# Patient Record
Sex: Male | Born: 1960 | Race: White | Hispanic: No | Smoking: Former smoker
Health system: Southern US, Community
[De-identification: ages and names within clinical notes are randomized; demographics above are authoritative.]

## PROBLEM LIST (undated history)

## (undated) DIAGNOSIS — C801 Malignant (primary) neoplasm, unspecified: Secondary | ICD-10-CM

---

## 2020-10-15 ENCOUNTER — Emergency Department (HOSPITAL_COMMUNITY)

## 2020-10-15 ENCOUNTER — Encounter (HOSPITAL_COMMUNITY): Payer: Self-pay | Admitting: Emergency Medicine

## 2020-10-15 ENCOUNTER — Observation Stay (HOSPITAL_COMMUNITY)

## 2020-10-15 ENCOUNTER — Inpatient Hospital Stay (HOSPITAL_COMMUNITY)
Admission: EM | Admit: 2020-10-15 | Discharge: 2020-10-22 | DRG: 025 | Disposition: A | Discharge status: Home / Self Care | Attending: Neurological Surgery | Admitting: Neurological Surgery

## 2020-10-15 ENCOUNTER — Other Ambulatory Visit: Payer: Self-pay

## 2020-10-15 DIAGNOSIS — R471 Dysarthria and anarthria: Secondary | ICD-10-CM | POA: Diagnosis present

## 2020-10-15 DIAGNOSIS — C7801 Secondary malignant neoplasm of right lung: Secondary | ICD-10-CM | POA: Diagnosis present

## 2020-10-15 DIAGNOSIS — G936 Cerebral edema: Secondary | ICD-10-CM | POA: Diagnosis present

## 2020-10-15 DIAGNOSIS — C7931 Secondary malignant neoplasm of brain: Secondary | ICD-10-CM | POA: Diagnosis present

## 2020-10-15 DIAGNOSIS — F17200 Nicotine dependence, unspecified, uncomplicated: Secondary | ICD-10-CM | POA: Diagnosis present

## 2020-10-15 DIAGNOSIS — C787 Secondary malignant neoplasm of liver and intrahepatic bile duct: Secondary | ICD-10-CM | POA: Diagnosis present

## 2020-10-15 DIAGNOSIS — S6992XA Unspecified injury of left wrist, hand and finger(s), initial encounter: Secondary | ICD-10-CM

## 2020-10-15 DIAGNOSIS — Z510 Encounter for antineoplastic radiation therapy: Secondary | ICD-10-CM | POA: Diagnosis present

## 2020-10-15 DIAGNOSIS — C799 Secondary malignant neoplasm of unspecified site: Secondary | ICD-10-CM | POA: Diagnosis not present

## 2020-10-15 DIAGNOSIS — T380X5A Adverse effect of glucocorticoids and synthetic analogues, initial encounter: Secondary | ICD-10-CM | POA: Diagnosis present

## 2020-10-15 DIAGNOSIS — F419 Anxiety disorder, unspecified: Secondary | ICD-10-CM | POA: Diagnosis present

## 2020-10-15 DIAGNOSIS — Z20822 Contact with and (suspected) exposure to covid-19: Secondary | ICD-10-CM | POA: Diagnosis present

## 2020-10-15 DIAGNOSIS — Z801 Family history of malignant neoplasm of trachea, bronchus and lung: Secondary | ICD-10-CM | POA: Diagnosis not present

## 2020-10-15 DIAGNOSIS — Z720 Tobacco use: Secondary | ICD-10-CM | POA: Diagnosis not present

## 2020-10-15 DIAGNOSIS — C642 Malignant neoplasm of left kidney, except renal pelvis: Secondary | ICD-10-CM | POA: Diagnosis present

## 2020-10-15 DIAGNOSIS — R29810 Facial weakness: Secondary | ICD-10-CM | POA: Diagnosis present

## 2020-10-15 DIAGNOSIS — G9389 Other specified disorders of brain: Secondary | ICD-10-CM | POA: Diagnosis present

## 2020-10-15 DIAGNOSIS — N2889 Other specified disorders of kidney and ureter: Secondary | ICD-10-CM

## 2020-10-15 DIAGNOSIS — C7802 Secondary malignant neoplasm of left lung: Secondary | ICD-10-CM | POA: Diagnosis present

## 2020-10-15 DIAGNOSIS — R55 Syncope and collapse: Secondary | ICD-10-CM | POA: Diagnosis not present

## 2020-10-15 DIAGNOSIS — D72828 Other elevated white blood cell count: Secondary | ICD-10-CM | POA: Diagnosis present

## 2020-10-15 DIAGNOSIS — D496 Neoplasm of unspecified behavior of brain: Secondary | ICD-10-CM | POA: Diagnosis present

## 2020-10-15 HISTORY — DX: Malignant (primary) neoplasm, unspecified: C80.1

## 2020-10-15 LAB — CBC WITH DIFFERENTIAL/PLATELET
Abs Immature Granulocytes: 0.04 10*3/uL (ref 0.00–0.07)
Basophils Absolute: 0.1 10*3/uL (ref 0.0–0.1)
Basophils Relative: 1 %
Eosinophils Absolute: 0.1 10*3/uL (ref 0.0–0.5)
Eosinophils Relative: 1 %
HCT: 39.3 % (ref 39.0–52.0)
Hemoglobin: 13.4 g/dL (ref 13.0–17.0)
Immature Granulocytes: 0 %
Lymphocytes Relative: 26 %
Lymphs Abs: 2.6 10*3/uL (ref 0.7–4.0)
MCH: 28.3 pg (ref 26.0–34.0)
MCHC: 34.1 g/dL (ref 30.0–36.0)
MCV: 83.1 fL (ref 80.0–100.0)
Monocytes Absolute: 0.8 10*3/uL (ref 0.1–1.0)
Monocytes Relative: 8 %
Neutro Abs: 6.1 10*3/uL (ref 1.7–7.7)
Neutrophils Relative %: 64 %
Platelets: 207 10*3/uL (ref 150–400)
RBC: 4.73 MIL/uL (ref 4.22–5.81)
RDW: 14.1 % (ref 11.5–15.5)
WBC: 9.7 10*3/uL (ref 4.0–10.5)
nRBC: 0 % (ref 0.0–0.2)

## 2020-10-15 LAB — BASIC METABOLIC PANEL
Anion gap: 9 (ref 5–15)
BUN: 16 mg/dL (ref 8–23)
CO2: 25 mmol/L (ref 22–32)
Calcium: 9.4 mg/dL (ref 8.9–10.3)
Chloride: 103 mmol/L (ref 98–111)
Creatinine, Ser: 0.9 mg/dL (ref 0.61–1.24)
GFR, Estimated: >60 mL/min (ref >60)
Glucose, Bld: 107 mg/dL — ABNORMAL HIGH (ref 70–99)
Potassium: 4 mmol/L (ref 3.5–5.1)
Sodium: 137 mmol/L (ref 135–145)

## 2020-10-15 LAB — HIV ANTIBODY (ROUTINE TESTING W REFLEX): HIV Screen 4th Generation wRfx: NONREACTIVE

## 2020-10-15 LAB — RESP PANEL BY RT-PCR (FLU A&B, COVID) ARPGX2
Influenza A by PCR: NEGATIVE
Influenza B by PCR: NEGATIVE
SARS Coronavirus 2 by RT PCR: NEGATIVE

## 2020-10-15 IMAGING — CT CT CHEST-ABD-PELV W/ CM
2 of 4 series · 7 of 36 positions shown, 13 images · IV contrast (OMNIPAQUE 300)
Comparison: None.

CLINICAL DATA: Oncology staging. Probable metastasis on CT and MRI
imaging of the brain.

EXAM:
CT CHEST, ABDOMEN, AND PELVIS WITH CONTRAST
TECHNIQUE: Multidetector CT imaging of the chest, abdomen and pelvis was
performed following the standard protocol during bolus
administration of intravenous contrast.
CONTRAST:  100mL OMNIPAQUE IOHEXOL 300 MG/ML  SOLN

[Series 2: delay · axial · delayed · 0.89mm/px · z∈[+1240,+1385]mm · 4 of 49 slices shown, 9 images]
[im 10/49  mediastinal]
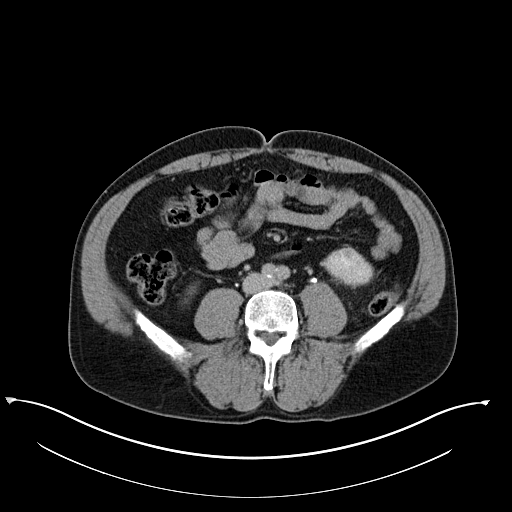
[im 10/49  lung]
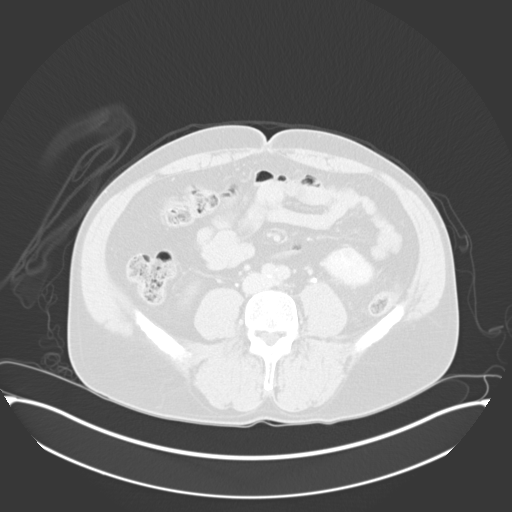
[im 10/49  bone]
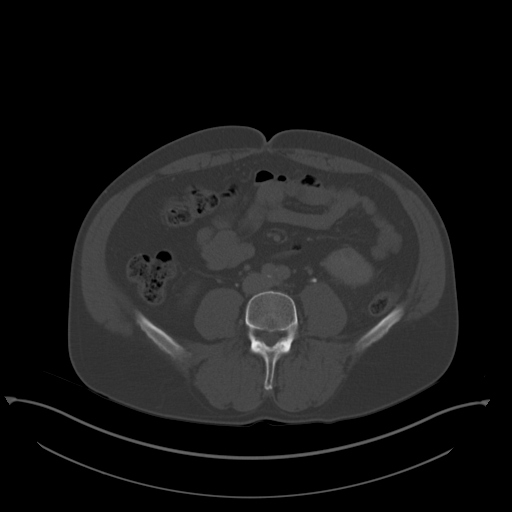
[im 20/49  mediastinal]
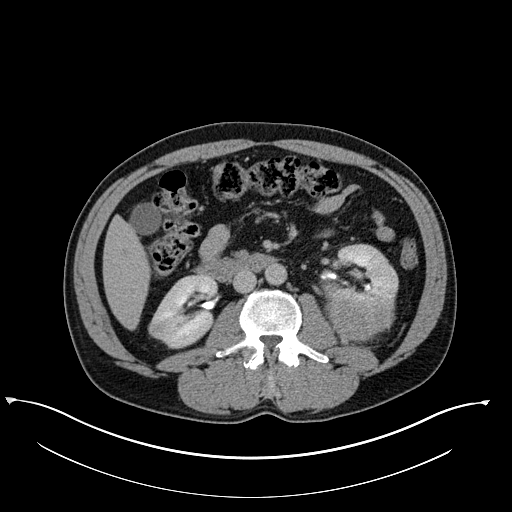
[im 20/49  lung]
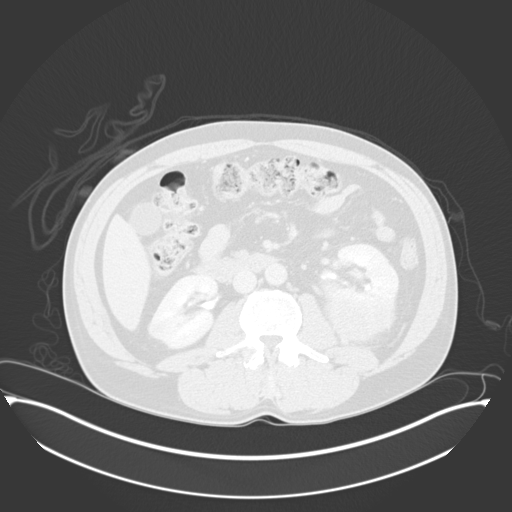
[im 29/49  mediastinal]
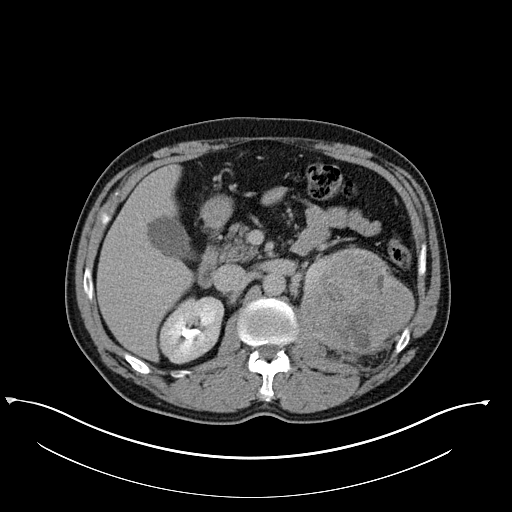
[im 29/49  lung]
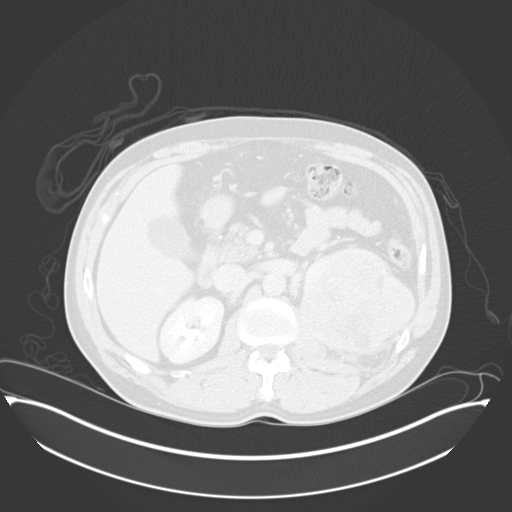
[im 39/49  mediastinal]
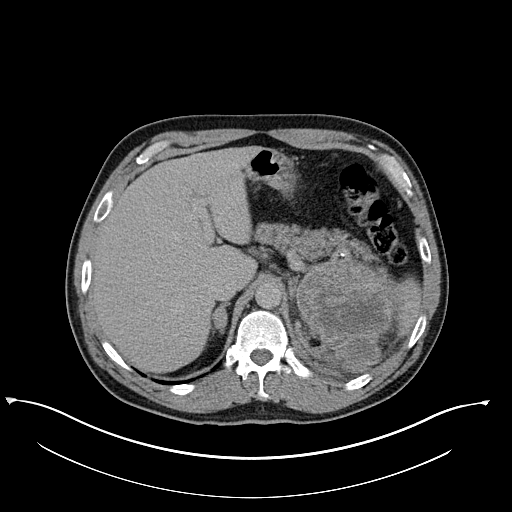
[im 39/49  lung]
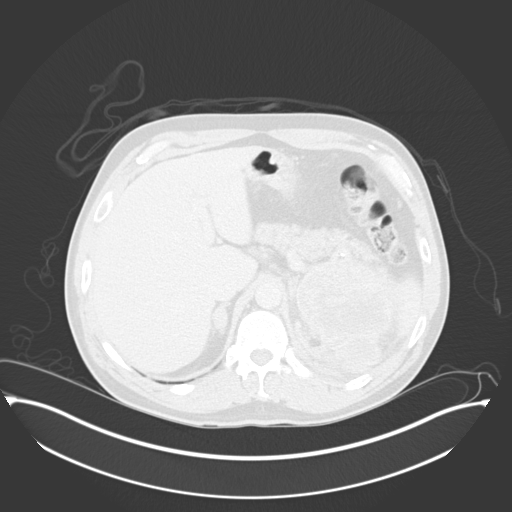

[Series 6: coronals · coronal · 0.91mm/px · 3 of 147 slices shown, 4 images]
[im 30/147  mediastinal]
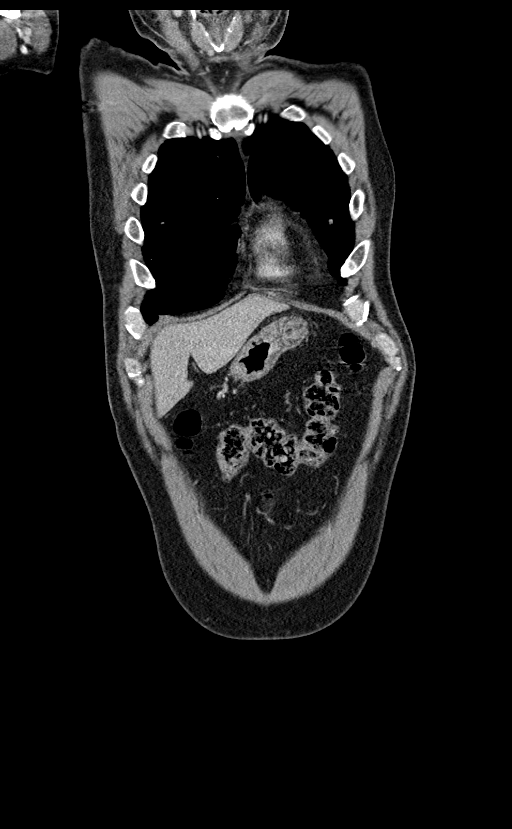
[im 59/147  mediastinal]
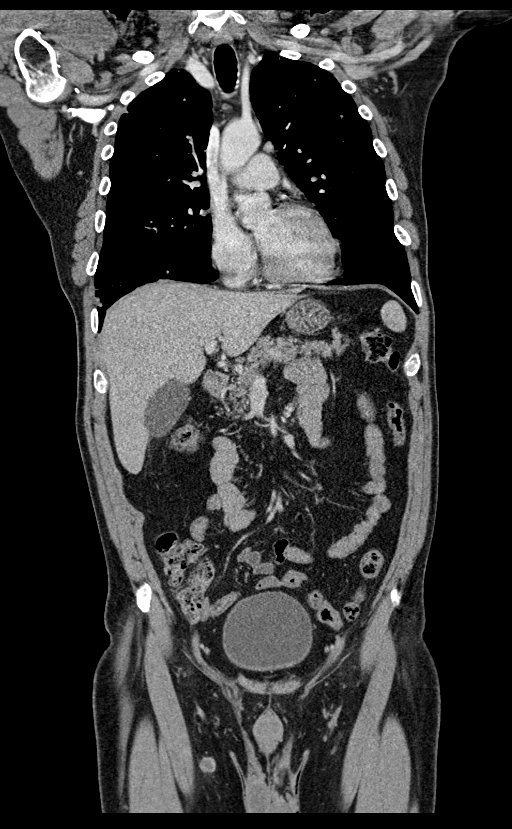
[im 59/147  bone]
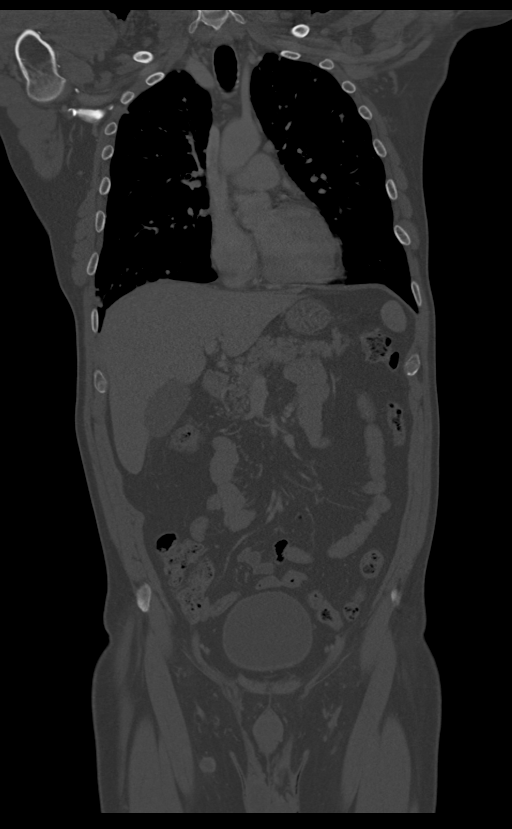
[im 88/147  mediastinal]
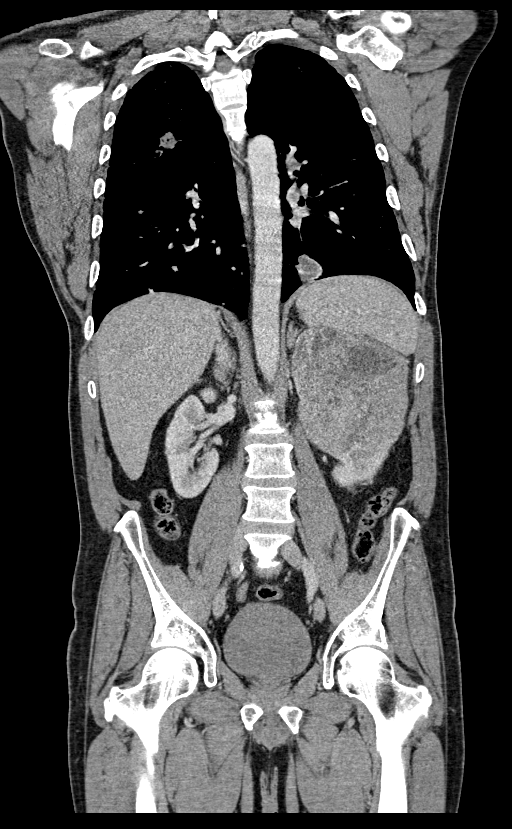

[7 of 36 positions shown; findings below may reference images not displayed]

FINDINGS: CT CHEST FINDINGS

Cardiovascular: The thoracic aorta is normal. Central pulmonary
arteries are unremarkable. Mild calcified atherosclerosis in the
LAD. The heart size is normal.

Mediastinum/Nodes: No enlarged mediastinal, hilar, or axillary lymph
nodes. Thyroid gland, trachea, and esophagus demonstrate no
significant findings.

Lungs/Pleura: Central airways are normal. No pneumothorax. Numerous
bilateral pulmonary nodules consistent with metastatic disease. A
representative nodule in the right upper lobe measures up to 2.7 cm
on series 4, image 62. There is either 1 snowman shaped mass or 2
adjacent masses in the medial left lung base on series 4, image 121.
I would favor 2 adjacent masses. The larger of the 2 masses measures
2.7 cm.

Musculoskeletal: No chest wall mass or suspicious bone lesions
identified.

CT ABDOMEN PELVIS FINDINGS

Hepatobiliary: A tiny low-attenuation lesion is seen in the inferior
right hepatic lobe on series 3, image 81, too small to characterize.
A small benign lesion such as a cyst is favored. The liver and
gallbladder are otherwise normal. Portal veins are normal.

Pancreas: Unremarkable. No pancreatic ductal dilatation or
surrounding inflammatory changes.

Spleen: Normal in size without focal abnormality.

Adrenals/Urinary Tract: There is a nodule in the right adrenal gland
on series 3, image 62 measuring up to 1.6 cm. There is a nodule in
the left adrenal gland on series 3, image 60 measuring up to 2.1 cm.

Slight decreased attenuation in the right lateral kidney with an
apparent bulge in this location is worrisome for a subtle solid mass
measuring approximately 1.9 cm. The right kidney is otherwise
normal. The right ureter is normal.

There is a large mass in the superior half of the left kidney
measuring at least 11.3 by 10.3 by 9.8 cm. There may be a subtle
mass off the inferior pole of the left kidney seen on coronal image
84 measuring 16 mm as well. No obvious tumor thrombus in the left
renal vein. No hydronephrosis. The right ureter is normal. The
bladder is normal. The left renal mass is hypervascular with
multiple feeding vessels. There is also increased attenuation in the
adjacent soft tissues. There may be invasion of the adjacent soft
tissues as seen on coronal image 113 where tumor appears to abut the
left crus.

Stomach/Bowel: The stomach and small bowel are normal. The colon is
normal. The appendix is not visualized but there is no secondary
evidence of appendicitis.

Vascular/Lymphatic: Mild calcified atherosclerosis is seen in the
abdominal aorta, extending into the iliac vessels.

Reproductive: Prostate is unremarkable.

Other: No free air free fluid.

Musculoskeletal: No acute or significant osseous findings.
IMPRESSION: 1. There is a large mass measuring 11.3 x 10.3 x 9.8 cm centered in
the upper half of the left kidney consistent with renal cell
carcinoma. The mass is hypervascular with multiple feeding vessels.
There may be extension of tumor into the surrounding soft tissues.
Tumor appears to abut the left crus. There is no definite vascular
involvement associated with the renal artery or vein. Recommend MRI
for complete evaluation.
2. There may be another small 16 mm solid mass in the lower pole of
the left kidney which is suspicious for a second malignancy.
Recommend attention on MRI.
3. There is suggestion of a small solid mass in the lower pole the
right kidney which is suspicious for malignancy. Recommend attention
on MRI.
4. Numerous bilateral pulmonary nodules consistent with metastatic
disease.
5. Bilateral adrenal nodules worrisome for metastatic disease.
6. A tiny low-attenuation lesion in the inferior right hepatic lobe
is nonspecific. Recommend attention on MRI. This is favored to
represent a small benign cyst.
7. Mild calcified atherosclerosis in the thoracic and abdominal
aorta.

## 2020-10-15 IMAGING — MR MR HEAD WO/W CM
15 series · 48 of 48 positions shown · IV contrast (8ML GADAVIST)
Comparison: Head CT [WP] hours.

CLINICAL DATA: 59-year-old male with left side weakness and
abnormal right hemisphere on noncontrast head CT earlier today.

EXAM:
MRI HEAD WITHOUT AND WITH CONTRAST
TECHNIQUE: Multiplanar, multiecho pulse sequences of the brain and surrounding
structures were obtained without and with intravenous contrast.
CONTRAST:  8mL GADAVIST GADOBUTROL 1 MMOL/ML IV SOLN

[Series 5: DWI · axial · 3.0mm · 1.36mm/px · z∈[-67,+103]mm · 5 of 116 slices shown (1 of 2)]
[im 1/116]
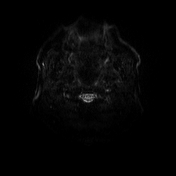
[im 29/116]
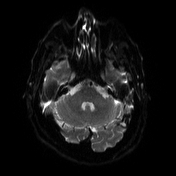
[im 58/116]
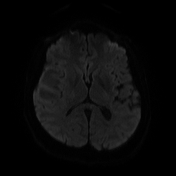
[im 87/116]
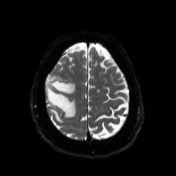
[im 116/116]
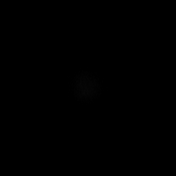

[Series 6: DWI · axial · 3.0mm · 1.36mm/px · z∈[-67,+103]mm · 3 of 58 slices shown (2 of 2)]
[im 1/58]
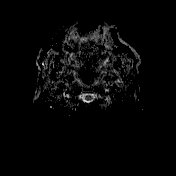
[im 29/58]
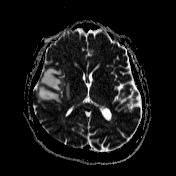
[im 58/58]
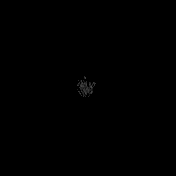

[Series 7: T1 · sagittal · 5.0mm · 0.75mm/px · 1 of 28 slices shown (1 of 4)]
[im 1/28]
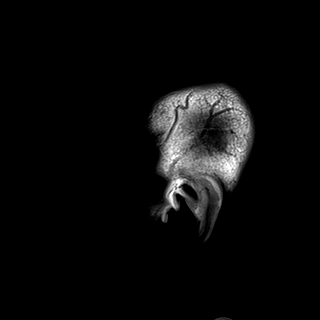

[Series 8: T2 · axial · 5.0mm · 0.62mm/px · 1 of 28 slices shown]
[im 1/28]
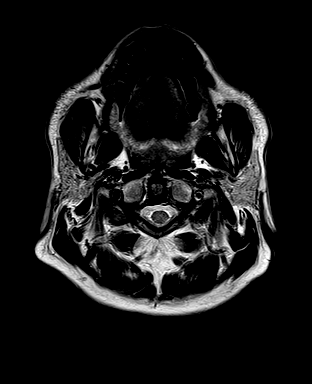

[Series 10: swi_images · axial · 3.0mm · 0.75mm/px · z∈[-73,+104]mm · 3 of 60 slices shown]
[im 1/60]
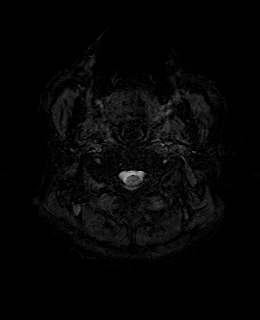
[im 30/60]
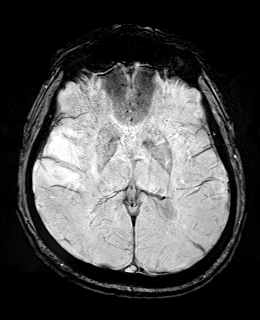
[im 60/60]
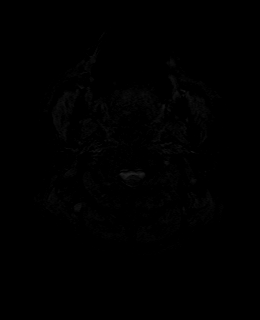

[Series 11: FLAIR · axial · 3.0mm · 0.75mm/px · z∈[-70,+101]mm · 3 of 58 slices shown]
[im 1/58]
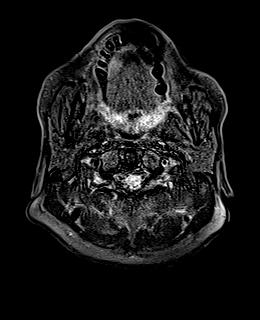
[im 29/58]
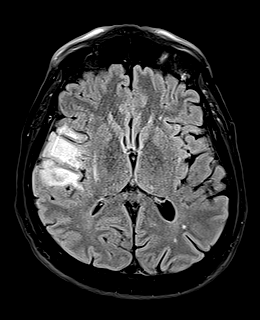
[im 58/58]
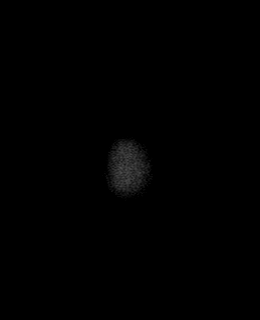

[Series 12: T1 · axial · 1.0mm · 0.94mm/px · z∈[-70,+104]mm · 9 of 176 slices shown (2 of 4)]
[im 1/176]
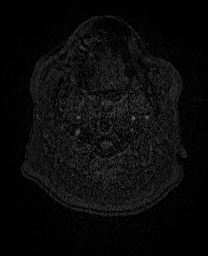
[im 22/176]
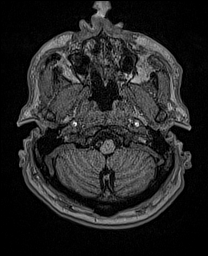
[im 44/176]
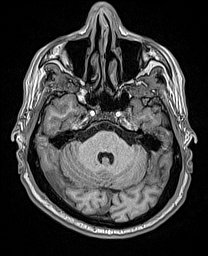
[im 66/176]
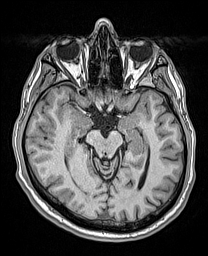
[im 88/176]
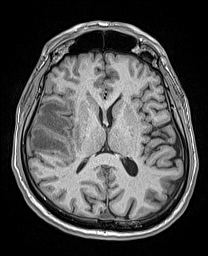
[im 110/176]
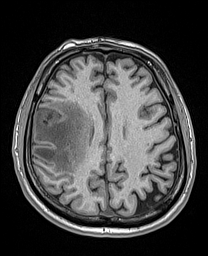
[im 132/176]
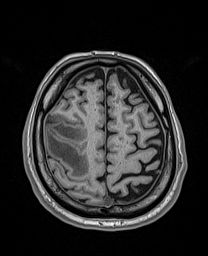
[im 154/176]
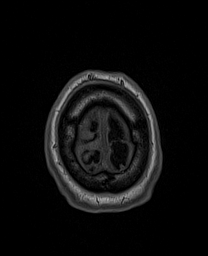
[im 176/176]
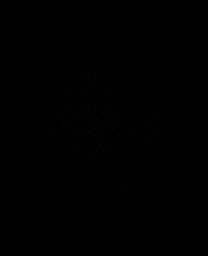

[Series 13: cor dwi_tracew · coronal · 5.0mm · 1.53mm/px · 3 of 62 slices shown]
[im 1/62]
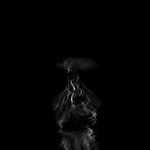
[im 31/62]
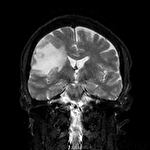
[im 62/62]
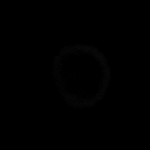

[Series 14: cor dwi_adc · coronal · 5.0mm · 1.53mm/px · 2 of 31 slices shown]
[im 1/31]
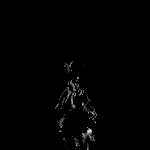
[im 31/31]
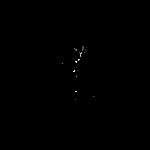

[Series 15: T2 post-contrast · coronal · 5.0mm · 0.57mm/px · 2 of 32 slices shown]
[im 1/32]
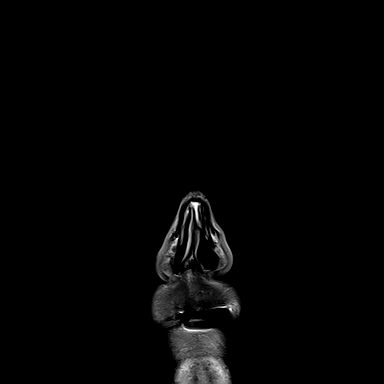
[im 32/32]
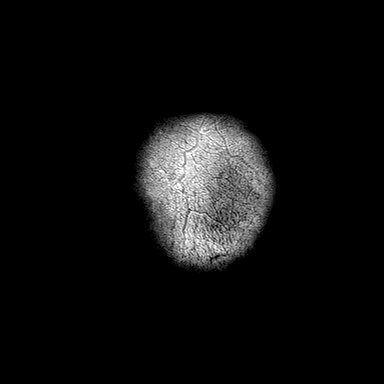

[Series 16: T1 post-contrast · axial · 1.0mm · 0.94mm/px · z∈[-70,+104]mm · 9 of 176 slices shown (1 of 3)]
[im 1/176]
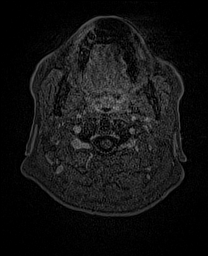
[im 22/176]
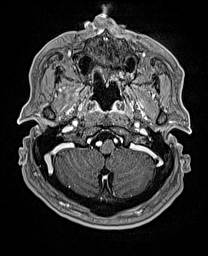
[im 44/176]
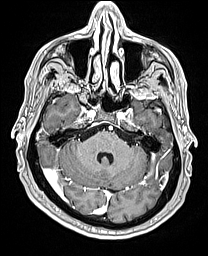
[im 66/176]
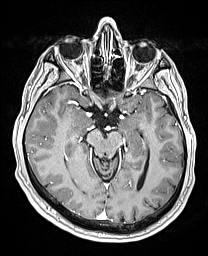
[im 88/176]
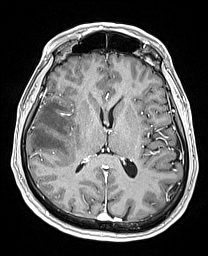
[im 110/176]
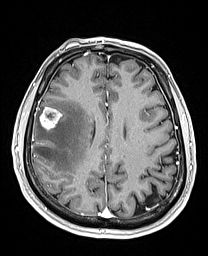
[im 132/176]
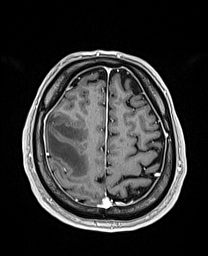
[im 154/176]
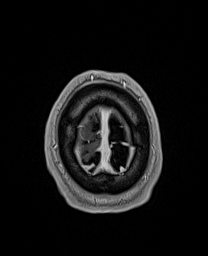
[im 176/176]
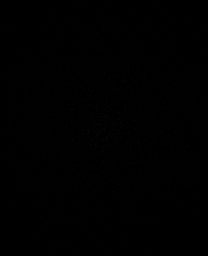

[Series 17: T1 · sagittal · 4.0mm · 0.94mm/px · 2 of 40 slices shown (3 of 4)]
[im 1/40]
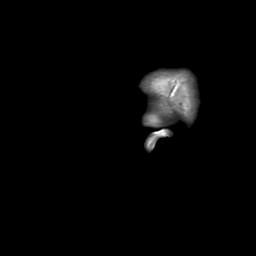
[im 40/40]
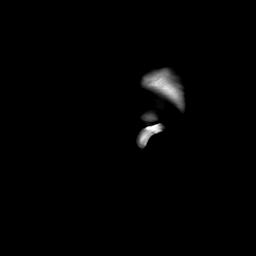

[Series 18: T1 · coronal · 4.0mm · 0.94mm/px · 2 of 46 slices shown (4 of 4)]
[im 1/46]
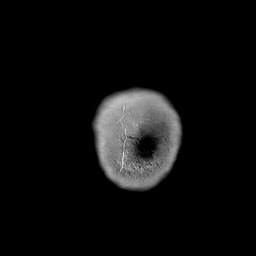
[im 46/46]
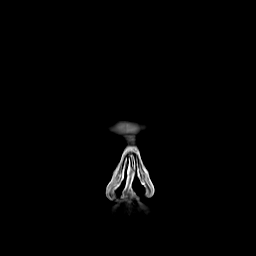

[Series 19: T1 post-contrast · coronal · 5.0mm · 0.43mm/px · 2 of 32 slices shown (2 of 3)]
[im 1/32]
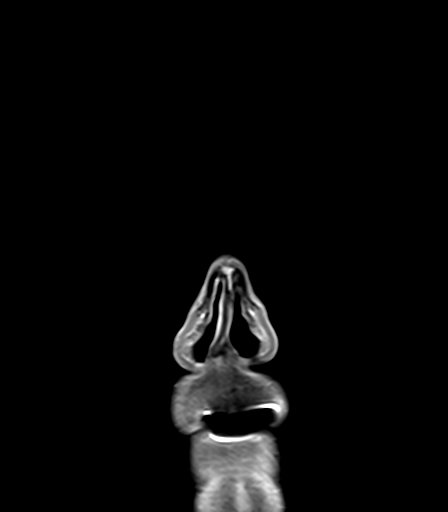
[im 32/32]
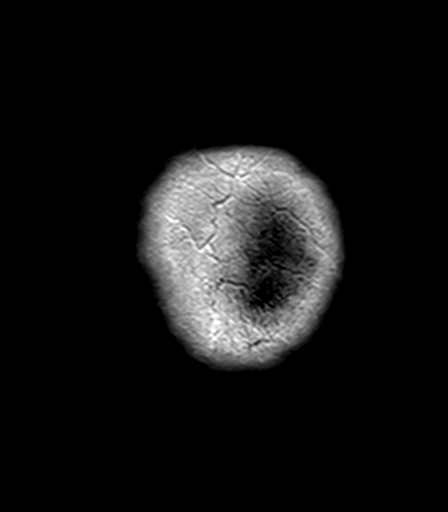

[Series 20: T1 post-contrast · sagittal · 5.0mm · 0.75mm/px · 1 of 28 slices shown (3 of 3)]
[im 1/28]
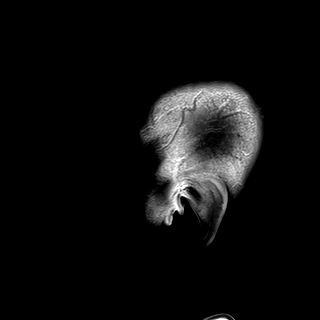

[48 of 48 positions shown; findings below may reference images not displayed]

FINDINGS: Brain: Solitary round but mildly lobulated heterogeneously enhancing
mass in the right middle frontal gyrus just above the frontal
operculum encompasses 24 x 22 by 19 mm and has a 1 or more small
feeding vessels visible (series 16 images 101 112. There is a large
amount of regional abnormal T2 and FLAIR hyperintensity in a pattern
most suggestive of vasogenic edema. Associated regional mass effect,
including on the right lateral ventricle, but no midline shift. The
edema is facilitated on diffusion but the enhancing lesion itself is
heterogeneously T2 hypointense and diffusion restricted indicating
hypercellularity (series 14, image 15). Mild hemosiderin also within
the mass.

No other abnormal intracranial enhancement or similar lesion
elsewhere. No dural thickening. There is a chronic microhemorrhage
in the left inferior occipital lobe. Outside of the area of edema
there is scattered mostly subcortical white matter T2 and FLAIR
hyperintensity which is mild to moderate for age and in a
nonspecific configuration.

No restricted diffusion suggestive of acute infarction. No
ventriculomegaly, extra-axial collection or acute intracranial
hemorrhage. Cervicomedullary junction and pituitary are within
normal limits.

Vascular: Major intracranial vascular flow voids are preserved. The
right vertebral artery appears dominant. Major dural venous sinuses
are enhancing and appear to be patent.

Skull and upper cervical spine: Negative visible cervical spine and
spinal cord. Visualized bone marrow signal is within normal limits.

Sinuses/Orbits: Negative.

Other: Mastoids are clear. Visible internal auditory structures
appear normal. Small benign right forehead scalp lipoma (series 12,
image 108).
IMPRESSION: 1. Solitary hypervascular and hypercellular appearing 2.4 cm
enhancing mass in the right middle frontal gyrus with abundant
regional vasogenic edema. Microhemorrhage within the mass. Mild
regional mass effect, but no midline shift.
Differential considerations include solitary metastasis (especially
a hypervascular met such as renal cell carcinoma), CNS lymphoma, and
less likely high-grade glioma.
Follow-up CT Chest, Abdomen, and Pelvis may be valuable.

2. Otherwise mild to moderate for age nonspecific cerebral white
matter signal changes, most commonly due to chronic small vessel
disease.

## 2020-10-15 IMAGING — DX DG HAND COMPLETE 3+V*L*
3 series · 3 of 3 positions shown · non-contrast
Comparison: None.

CLINICAL DATA: Left thumb weakness

EXAM:
LEFT HAND - COMPLETE 3+ VIEW

[hand ap]
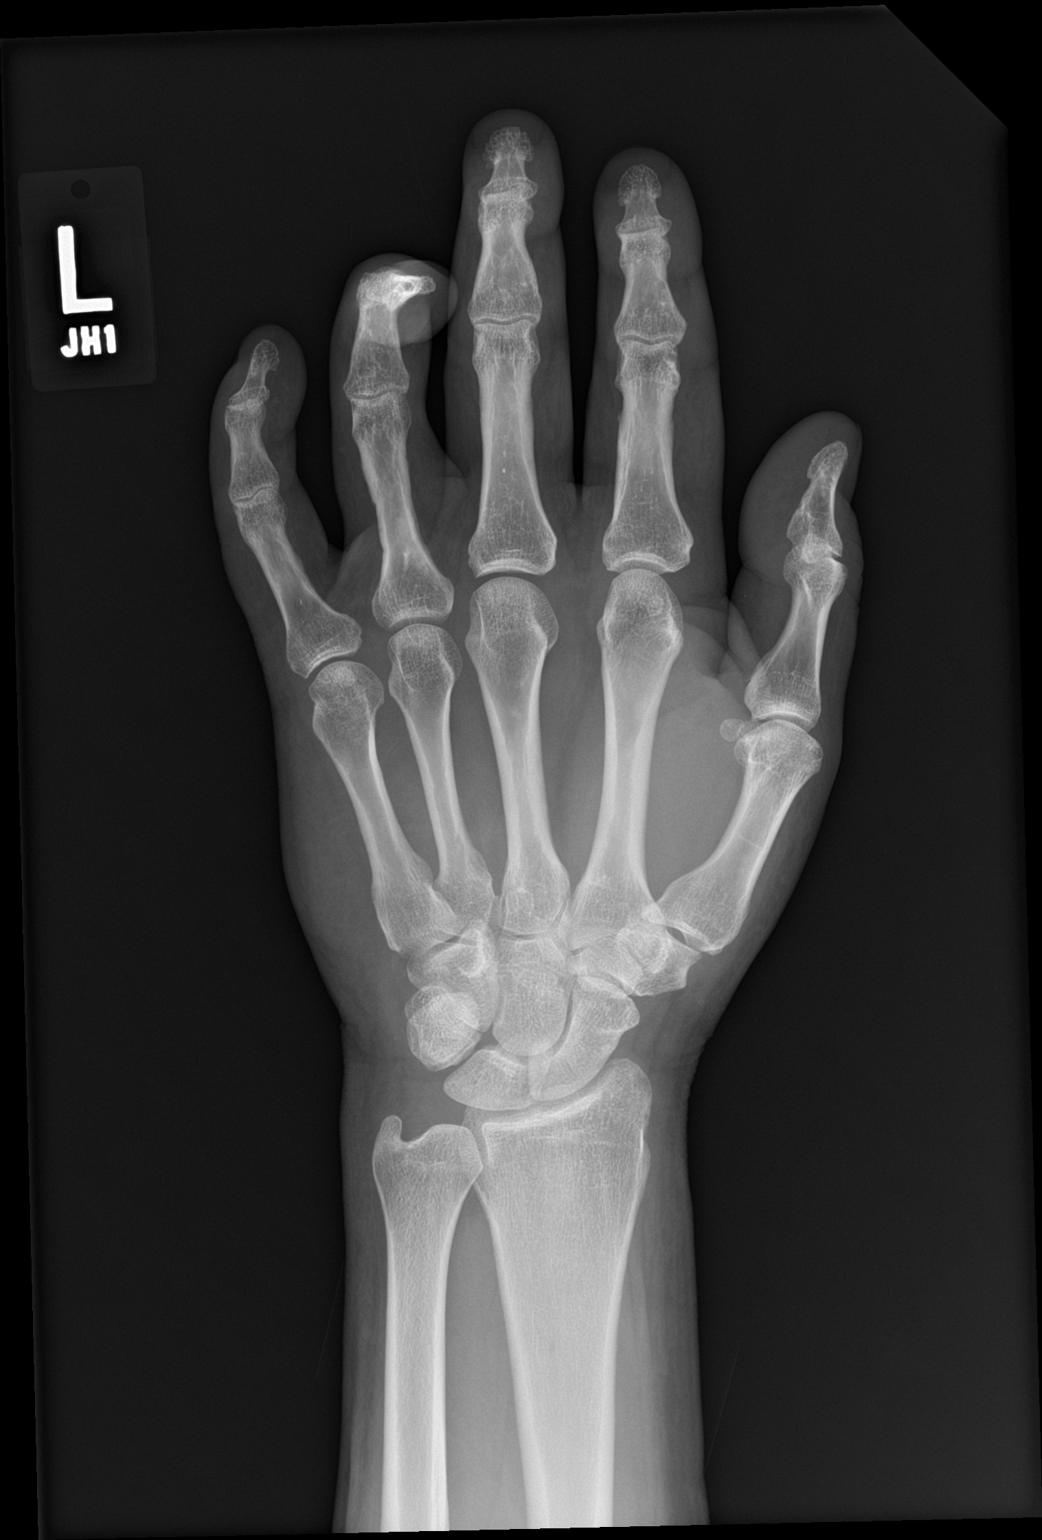

[hand obl]
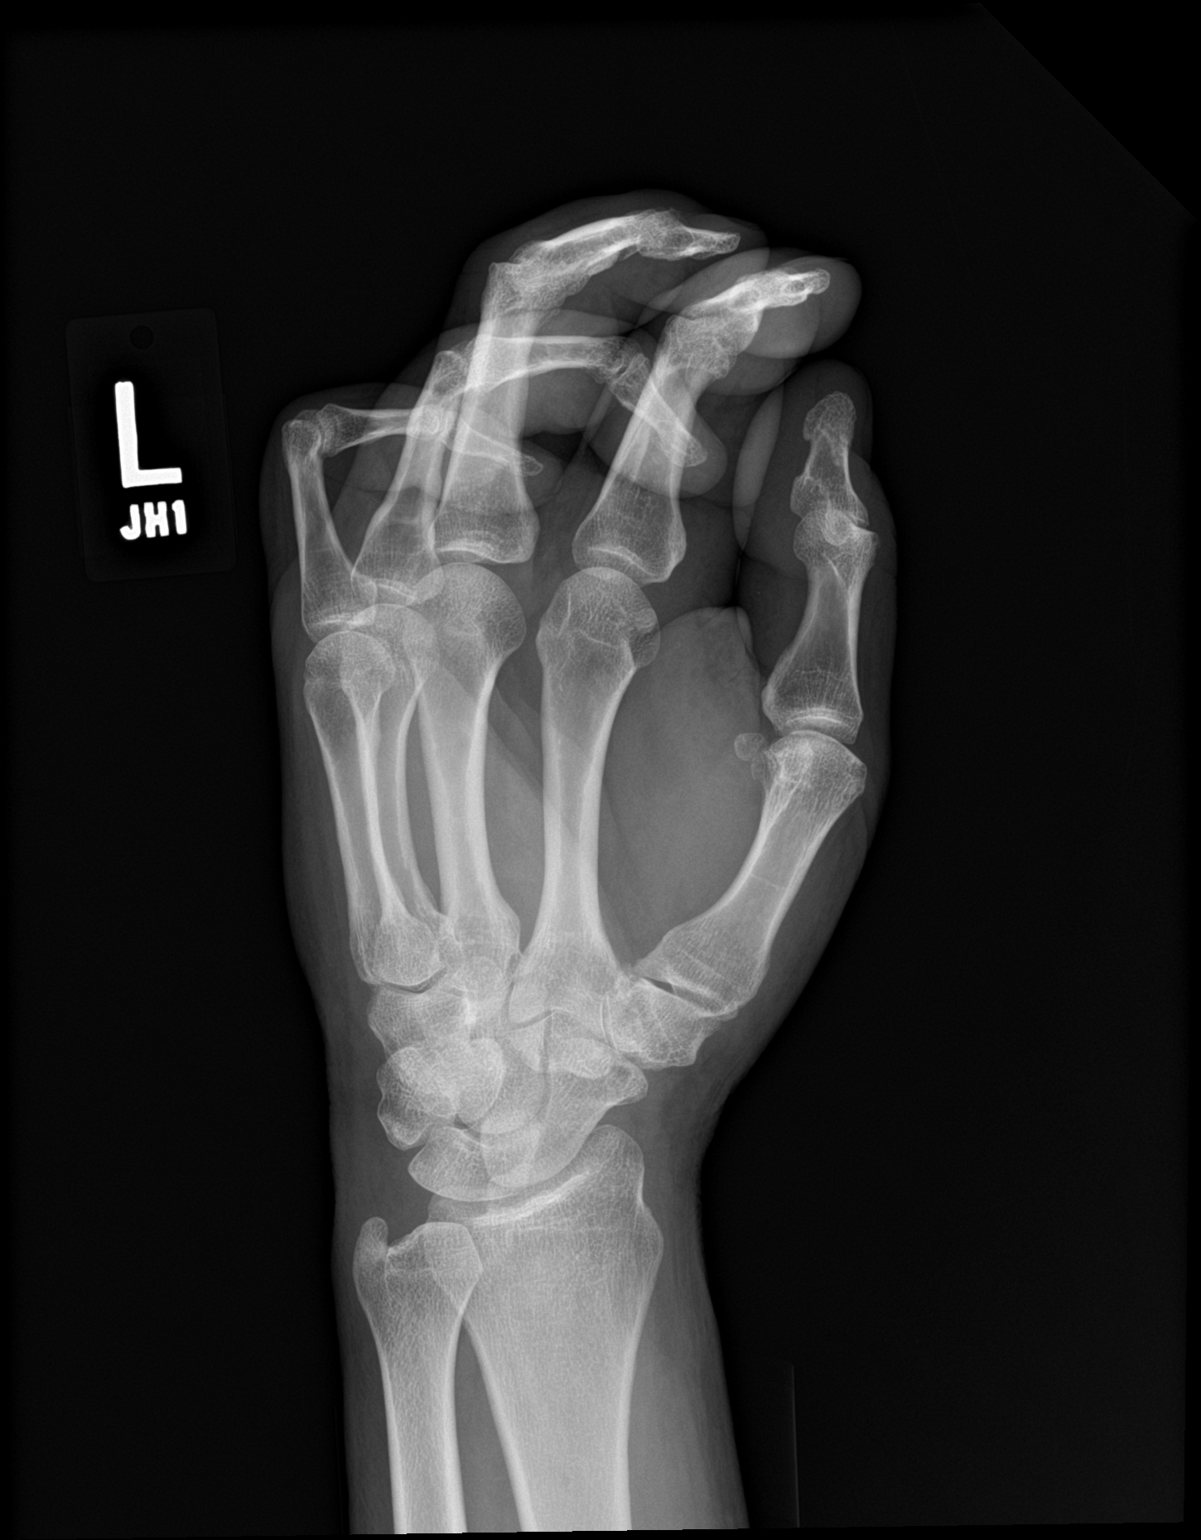

[hand lat]
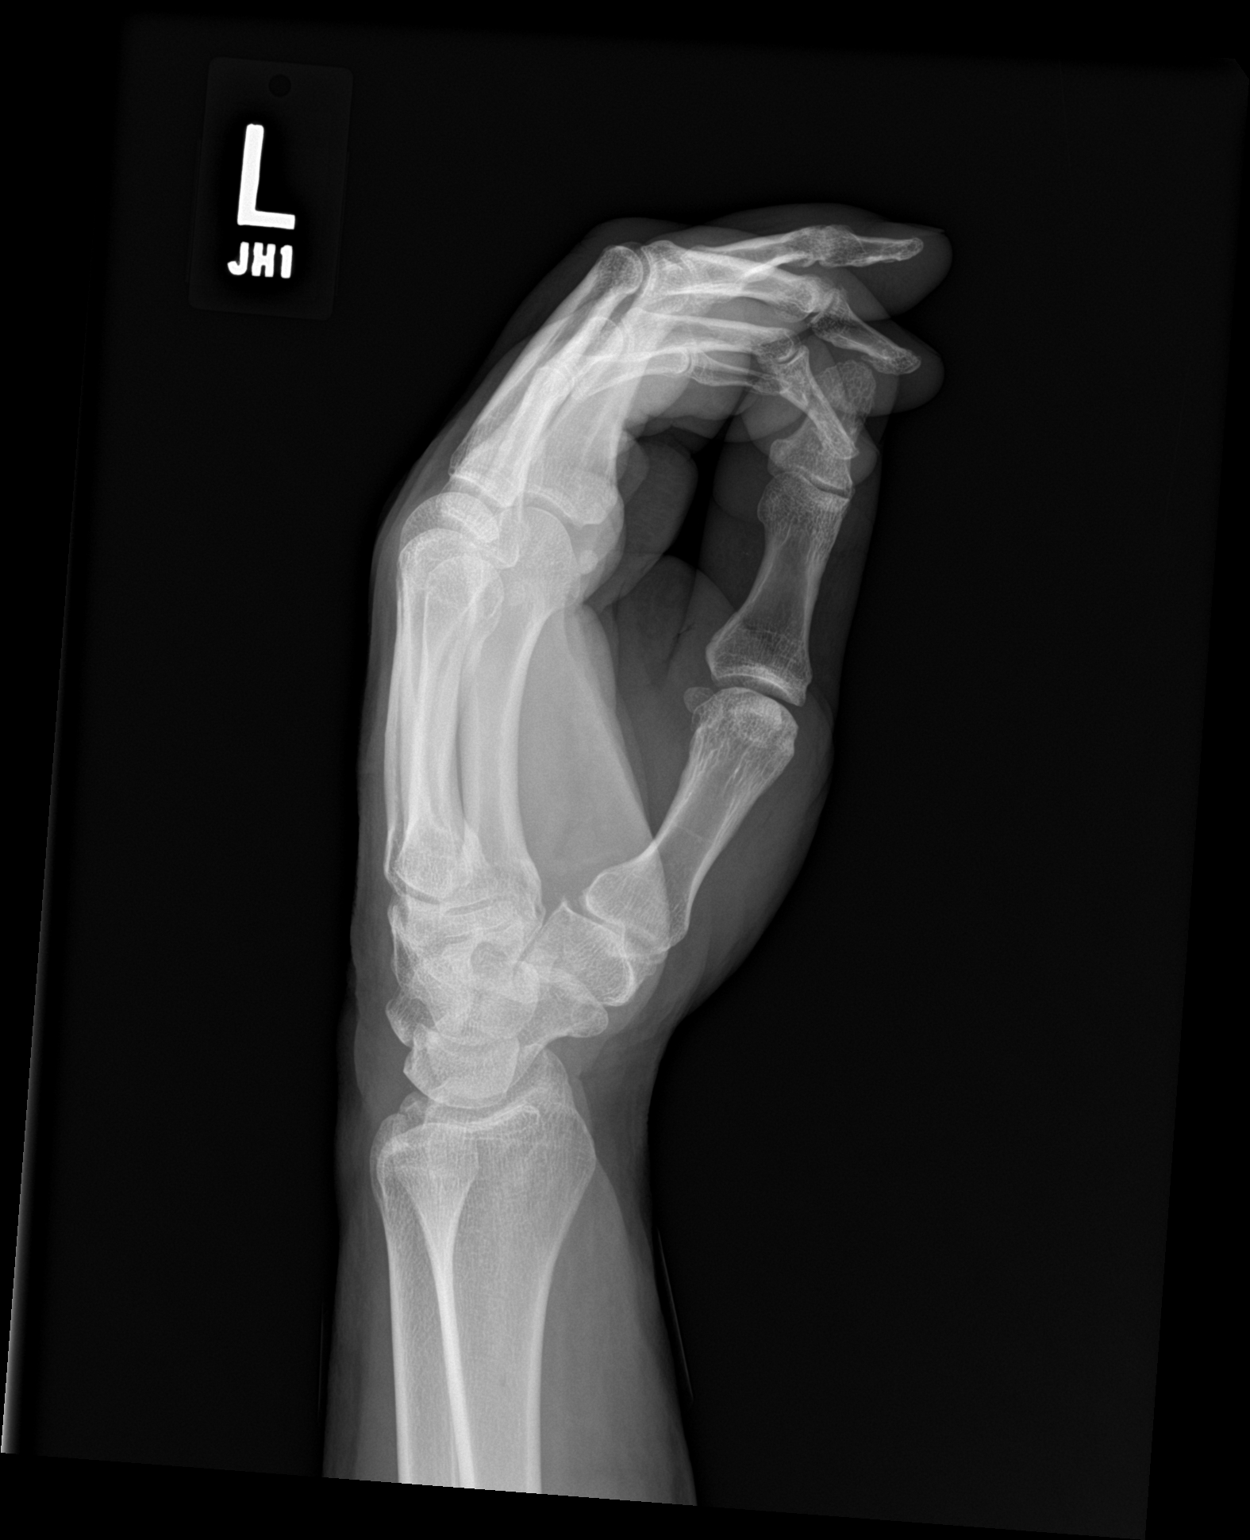

[3 of 3 positions shown; findings below may reference images not displayed]

FINDINGS: There is no evidence of fracture or dislocation. There is no
evidence of arthropathy or other focal bone abnormality. Soft
tissues are unremarkable.
IMPRESSION: Negative.

## 2020-10-15 IMAGING — CT CT HEAD W/O CM
3 series · 16 of 47 positions shown, 19 images · non-contrast
Comparison: None.

CLINICAL DATA: Left-sided weakness

EXAM:
CT HEAD WITHOUT CONTRAST
TECHNIQUE: Contiguous axial images were obtained from the base of the skull
through the vertex without intravenous contrast.

[Series 2: head wo · axial · 0.47mm/px · z∈[-218,-63]mm · 10 of 37 slices shown, 13 images]
[im 3/37  brain]
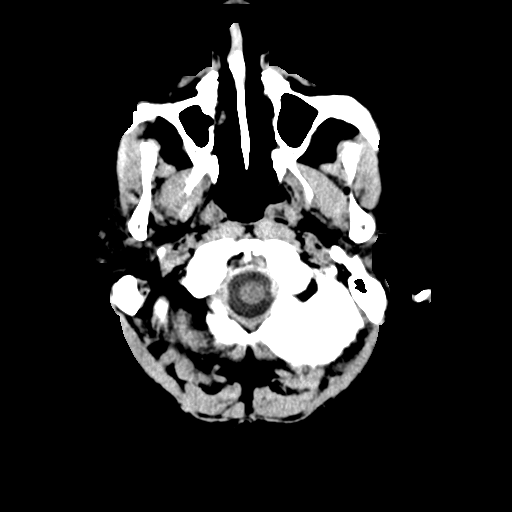
[im 3/37  bone]
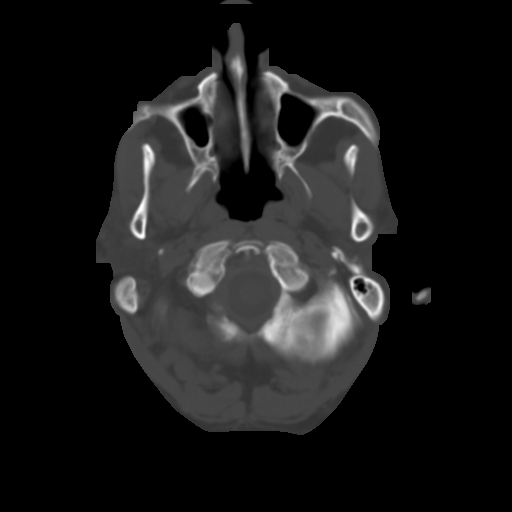
[im 7/37  brain]
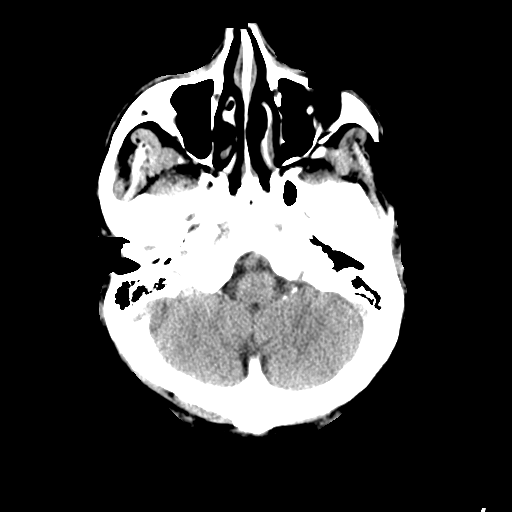
[im 10/37  brain]
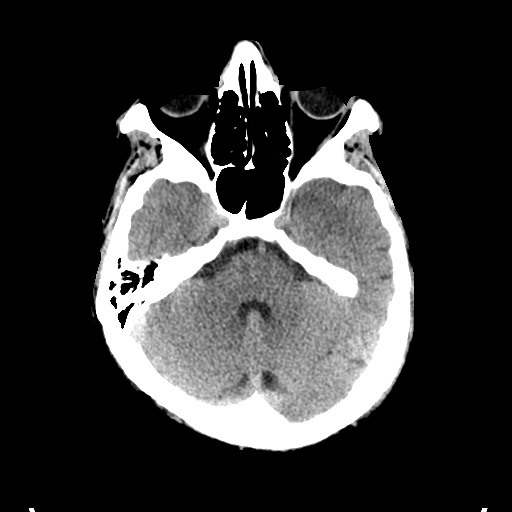
[im 13/37  brain]
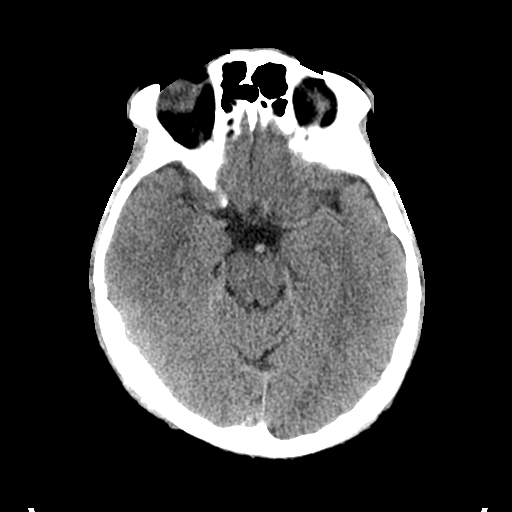
[im 17/37  brain]
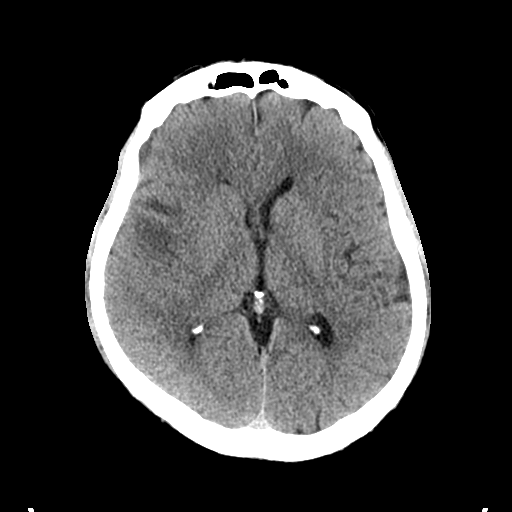
[im 17/37  bone]
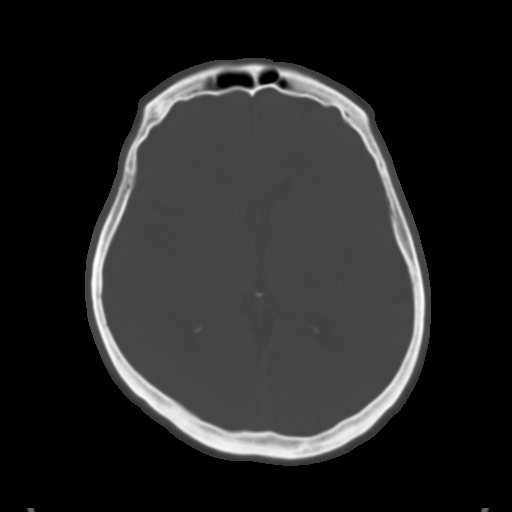
[im 20/37  brain]
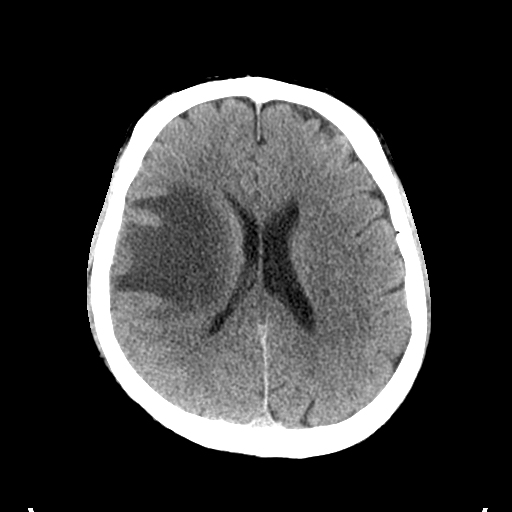
[im 24/37  brain]
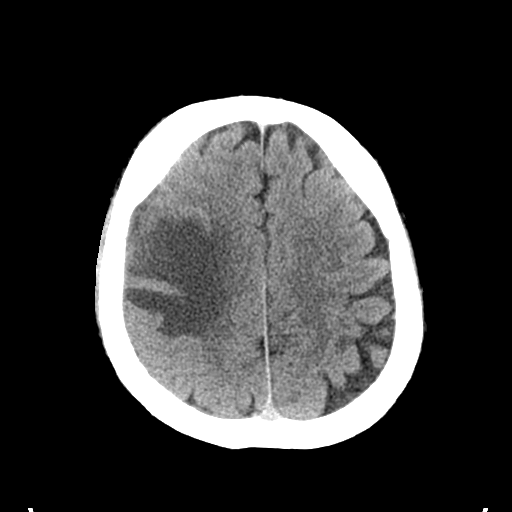
[im 28/37  brain]
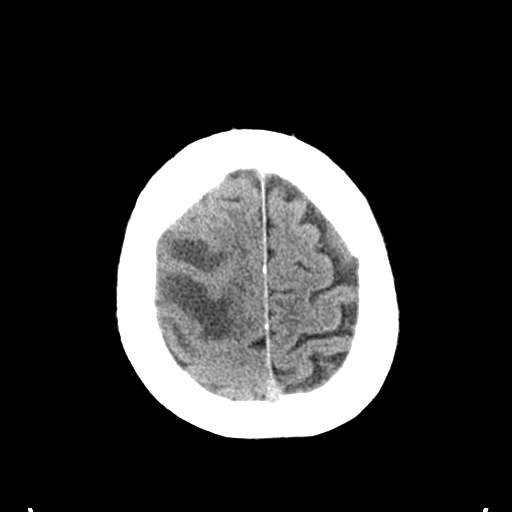
[im 30/37  brain]
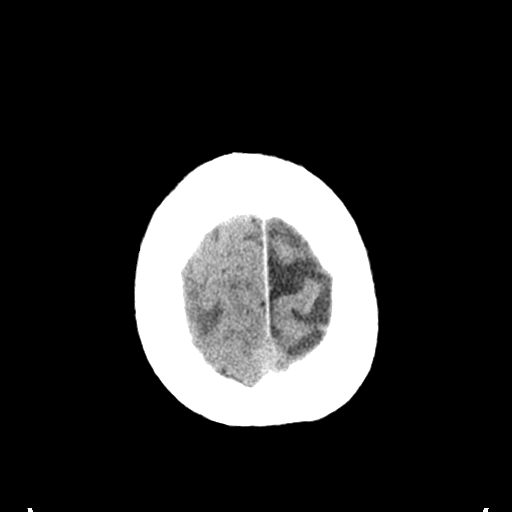
[im 30/37  bone]
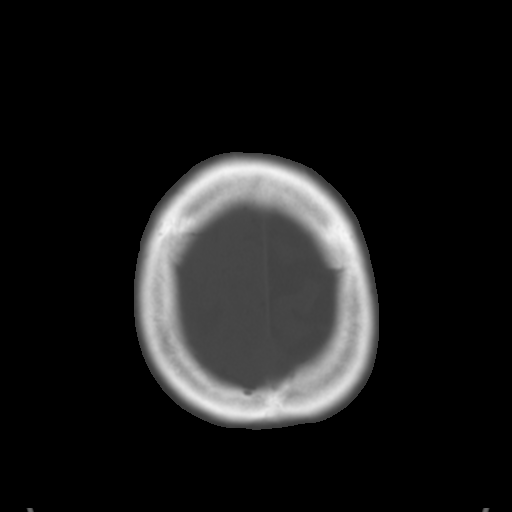
[im 34/37  brain]
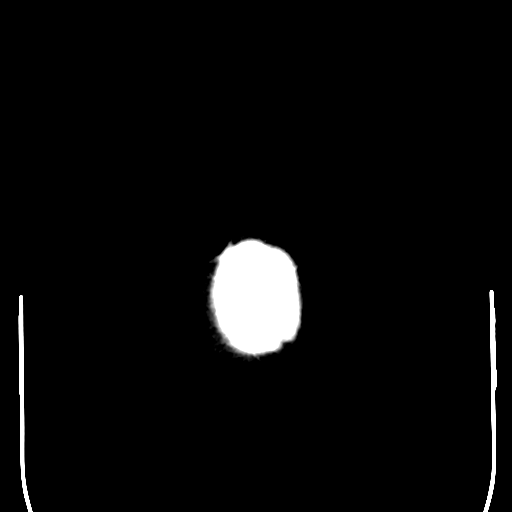

[Series 5: coronal soft tissue · coronal · 0.36mm/px · 3 of 77 slices shown]
[im 26/77  brain]
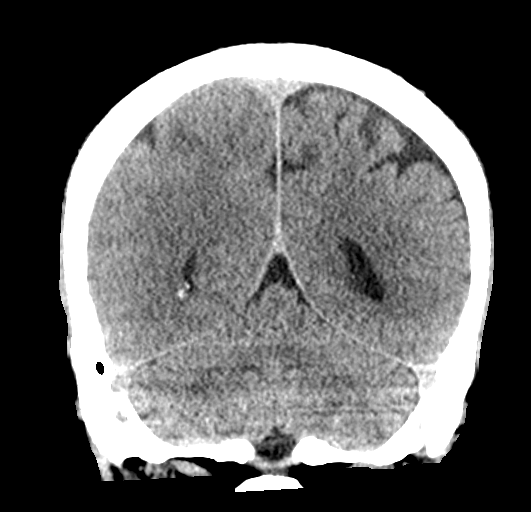
[im 34/77  brain]
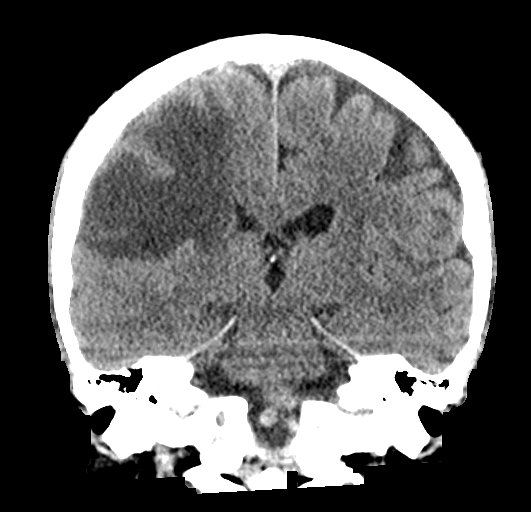
[im 43/77  brain]
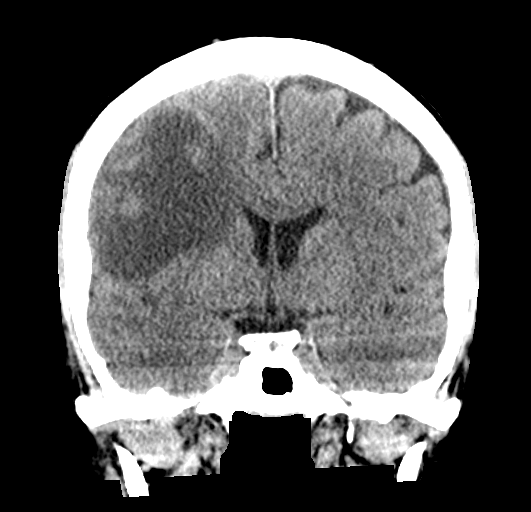

[Series 6: sagittal soft tissue · sagittal · 0.36mm/px · 3 of 65 slices shown]
[im 22/65  brain]
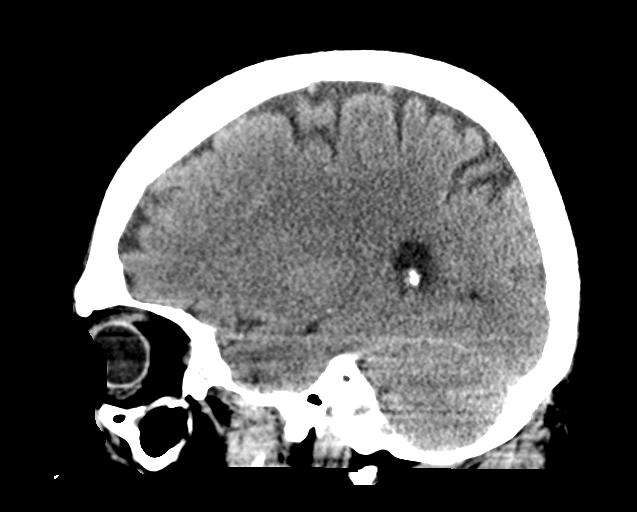
[im 33/65  brain]
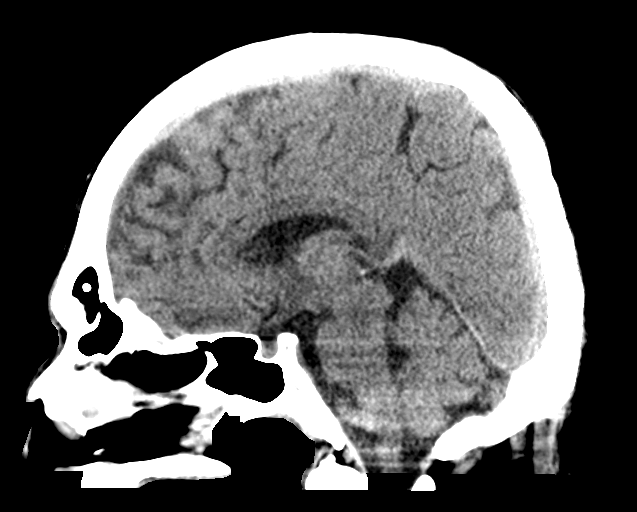
[im 43/65  brain]
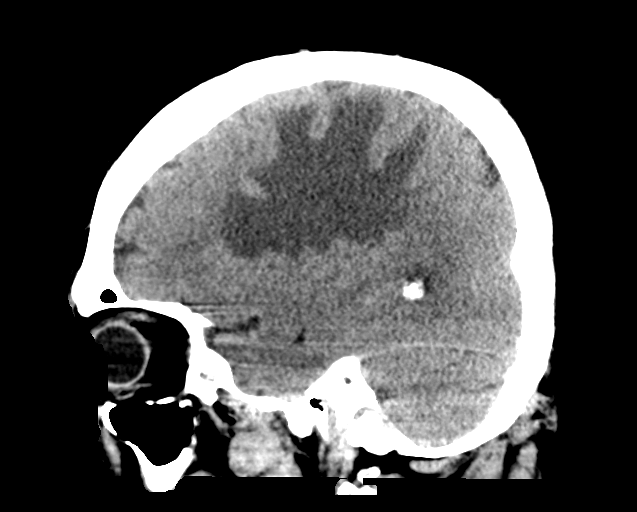

[16 of 47 positions shown; findings below may reference images not displayed]

FINDINGS: Brain: There is a 1.3 cm lesion in the right frontal lobe with a
large amount of surrounding vasogenic edema. There is mass effect on
the right lateral ventricle but no midline shift. No hemorrhage or
extra-axial collection.

Vascular: No hyperdense vessel or unexpected calcification.

Skull: Normal. Negative for fracture or focal lesion.

Sinuses/Orbits: No acute finding.

Other: None.
IMPRESSION: 1. 1.3 cm lesion in the right frontal lobe with a large amount of
surrounding vasogenic edema, most consistent with intracranial
metastatic disease. MRI of the brain with and without contrast is
recommended for further characterization.
2. Mass effect on the right lateral ventricle but no midline shift.

## 2020-10-15 MED ORDER — DEXAMETHASONE SODIUM PHOSPHATE 4 MG/ML IJ SOLN
4.0000 mg | Freq: Once | INTRAMUSCULAR | Status: AC
  Administered 2020-10-15: 4 mg via INTRAVENOUS
  Filled 2020-10-15: qty 1

## 2020-10-15 MED ORDER — SODIUM CHLORIDE 0.9% FLUSH
3.0000 mL | Freq: Two times a day (BID) | INTRAVENOUS | Status: DC
  Administered 2020-10-15 – 2020-10-20 (×11): 3 mL via INTRAVENOUS

## 2020-10-15 MED ORDER — DEXAMETHASONE SODIUM PHOSPHATE 4 MG/ML IJ SOLN
4.0000 mg | Freq: Four times a day (QID) | INTRAMUSCULAR | Status: DC
  Administered 2020-10-15 – 2020-10-16 (×4): 4 mg via INTRAVENOUS
  Filled 2020-10-15 (×4): qty 1

## 2020-10-15 MED ORDER — ACETAMINOPHEN 650 MG RE SUPP: 650.0000 mg | Freq: Four times a day (QID) | RECTAL | Status: DC | PRN

## 2020-10-15 MED ORDER — LORAZEPAM 2 MG/ML IJ SOLN: 0.5000 mg | Freq: Four times a day (QID) | INTRAMUSCULAR | Status: DC | PRN

## 2020-10-15 MED ORDER — GADOBUTROL 1 MMOL/ML IV SOLN
8.0000 mL | Freq: Once | INTRAVENOUS | Status: AC | PRN
  Administered 2020-10-15: 8 mL via INTRAVENOUS

## 2020-10-15 MED ORDER — POLYETHYLENE GLYCOL 3350 17 G PO PACK: 17.0000 g | PACK | Freq: Every day | ORAL | Status: DC | PRN

## 2020-10-15 MED ORDER — ACETAMINOPHEN 325 MG PO TABS: 650.0000 mg | ORAL_TABLET | Freq: Four times a day (QID) | ORAL | Status: DC | PRN

## 2020-10-15 MED ORDER — IOHEXOL 300 MG/ML  SOLN
100.0000 mL | Freq: Once | INTRAMUSCULAR | Status: AC | PRN
  Administered 2020-10-15: 100 mL via INTRAVENOUS

## 2020-10-15 MED ORDER — DEXAMETHASONE SODIUM PHOSPHATE 10 MG/ML IJ SOLN
6.0000 mg | Freq: Once | INTRAMUSCULAR | Status: AC
  Administered 2020-10-15: 6 mg via INTRAVENOUS
  Filled 2020-10-15: qty 1

## 2020-10-15 NOTE — ED Provider Notes (Signed)
[facility] DEPT Provider Note   CSN: [id number] Arrival date & time: 10/15/20  0040     History Chief Complaint  Patient presents with  . Head Injury    Kenneth Grant is a 60 y.o. male.  Patient presents from custody.  He states he was dizzy lost his balance and fell hit his head in the doorway.  He thinks he passed out he is not quite sure.  He also complaining of weakness to the left thumb.  He had prior injury there and prior weakness that required a tendon repair over 20 years ago.  He has been doing well until when he woke up he realized he had weakness again to the left thumb.  Denies any fevers or cough or vomiting or diarrhea, complains of a moderate headache.  Denies nausea.        History reviewed. No pertinent past medical history.  Patient Active Problem List   Diagnosis Date Noted  . Mass of left frontal lobe 10/15/2020         History reviewed. No pertinent family history.     Home Medications Prior to Admission medications   Not on File    Allergies    Penicillins  Review of Systems   Review of Systems  Constitutional: Negative for fever.  HENT: Negative for ear pain and sore throat.   Eyes: Negative for pain.  Respiratory: Negative for cough.   Cardiovascular: Negative for chest pain.  Gastrointestinal: Negative for abdominal pain.  Genitourinary: Negative for flank pain.  Musculoskeletal: Negative for back pain.  Skin: Negative for color change and rash.  Neurological: Positive for headaches.  All other systems reviewed and are negative.   Physical Exam Updated Vital Signs BP (!) 145/77 (BP Location: Right Arm)   Pulse 74   Temp 98.1 F (36.7 C) (Oral)   Resp 18   Ht 6' (1.829 m)   Wt 83.9 kg   SpO2 95%   BMI 25.09 kg/m   Physical Exam Constitutional:      General: He is not in acute distress.    Appearance: He is well-developed.  HENT:     Head: Normocephalic.     Comments: Superficial  abrasion to the right forehead.    Nose: Nose normal.  Eyes:     Extraocular Movements: Extraocular movements intact.  Cardiovascular:     Rate and Rhythm: Normal rate.  Pulmonary:     Effort: Pulmonary effort is normal.  Musculoskeletal:     Comments: Weakness to the left thumb.  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  Unable to extend his left thumb.  Otherwise neurovascularly intact.  Skin:    Coloration: Skin is not jaundiced.  Neurological:     Mental Status: He is alert. Mental status is at baseline.     ED Results / Procedures / Treatments   Labs (all labs ordered are listed, but only abnormal results are displayed) Labs Reviewed  BASIC METABOLIC PANEL - Abnormal; Notable for the following components:      Result Value   Glucose, Bld 107 (*)    All other components within normal limits  RESP PANEL BY RT-PCR (FLU A&B, COVID) [id number]  CBC WITH DIFFERENTIAL/PLATELET    EKG EKG Interpretation  Date/Time:  Sunday Oct 15 2020 02:37:37 EDT Ventricular Rate:  72 PR Interval:  171 QRS Duration: 93 QT Interval:  402 QTC Calculation: 440 R Axis:   8 Text Interpretation: Sinus rhythm Probable left  atrial enlargement Low voltage, extremity leads Anteroseptal infarct, old Abnormal T, consider ischemia, lateral leads 12 Lead; Mason-Likar Confirmed by Thamas Jaegers ([id number]) on 10/15/2020 3:36:47 AM   Radiology CT Head Wo Contrast  Result Date: 10/15/2020 CLINICAL DATA:  Left-sided weakness EXAM: CT HEAD WITHOUT CONTRAST TECHNIQUE: Contiguous axial images were obtained from the base of the skull through the vertex without intravenous contrast. COMPARISON:  None. FINDINGS: Brain: There is a 1.3 cm lesion in the right frontal lobe with a large amount of surrounding vasogenic edema. There is mass effect on the right lateral ventricle but no midline shift. No hemorrhage or extra-axial collection. Vascular: No hyperdense vessel or unexpected calcification. Skull: Normal.  Negative for fracture or focal lesion. Sinuses/Orbits: No acute finding. Other: None. IMPRESSION: 1. 1.3 cm lesion in the right frontal lobe with a large amount of surrounding vasogenic edema, most consistent with intracranial metastatic disease. MRI of the brain with and without contrast is recommended for further characterization. 2. Mass effect on the right lateral ventricle but no midline shift. Electronically Signed   By: Ulyses Jarred M.D.   On: 10/15/2020 02:43   DG Hand Complete Left  Result Date: 10/15/2020 CLINICAL DATA:  Left thumb weakness EXAM: LEFT HAND - COMPLETE 3+ VIEW COMPARISON:  None. FINDINGS: There is no evidence of fracture or dislocation. There is no evidence of arthropathy or other focal bone abnormality. Soft tissues are unremarkable. IMPRESSION: Negative. Electronically Signed   By: Ulyses Jarred M.D.   On: 10/15/2020 03:29    Procedures Procedures   Medications Ordered in ED Medications  dexamethasone (DECADRON) injection 4 mg (has no administration in time range)  dexamethasone (DECADRON) injection 4 mg (4 mg Intravenous Given 10/15/20 0410)  dexamethasone (DECADRON) injection 6 mg (6 mg Intravenous Given 10/15/20 0420)    ED Course  I have reviewed the triage vital signs and the nursing notes.  Pertinent labs & imaging results that were available during my care of the patient were reviewed by me and considered in my medical decision making (see chart for details).  Clinical Course as of 10/15/20 0519  Sun Oct 15, 2020  0359 Abs Immature Granulocytes: 0.04 [JH]    Clinical Course User Index [JH] Luna Fuse, MD   MDM Rules/Calculators/A&P                          CT of the head concerning for right-sided frontal mass approximately 1.3 cm with large amount of surrounding vasogenic edema.  Mass-effect shown but no midline shift noted.  Patient remains awake and alert with no focal neurodeficit.  X-rays of the left hand otherwise unremarkable.  MRI of  the brain ordered and pending, patient started on IV Decadron here.  Case discussed with hospitalist for admission and MRI of the brain. Final Clinical Impression(s) / ED Diagnoses Final diagnoses:  Brain mass  Injury of left thumb, initial encounter    Rx / DC Orders ED Discharge Orders    None       Luna Fuse, MD 10/15/20 [phone]

## 2020-10-15 NOTE — ED Notes (Signed)
Patient transported to CT 

## 2020-10-15 NOTE — Progress Notes (Signed)
Patient approved for CT guided left renal mass biopsy per IR attending Dr. Laurence Ferrari - will make patient NPO at midnight on 5/23 in case IR can accommodate on Monday. Please hold any ASA/anticoagulation until post procedure.   IR PA will see for full consult/consent.  Please call with questions or concerns.  Candiss Norse, PA-C

## 2020-10-15 NOTE — ED Triage Notes (Signed)
Pt is inmate from jail. He the jail staff, he became dizzy and fell. He reports L sided weakness (has hardware placed in arm from a past injury.) Jail wants patient evaluated for concussion or injury r/t to fall.

## 2020-10-15 NOTE — H&P (Addendum)
History and Physical        Hospital Admission Note Date: 10/15/2020  Patient name: Kenneth Grant Medical record number: [id number] Date of birth: [birth year] Age: 60 y.o. Gender: male  PCP: Pcp, No  [facility] from: prison   Chief Complaint    Chief Complaint  Patient presents with  . Head Injury      HPI:   This is a 60 year old male with no known past medical history who presents from jail with two marshals after he had an episode of dizziness and syncopal episode yesterday and hit his head. He says he was in his usual state of health up until that point when he was eating lunch, went to stand up and lost consciousness. Also complaining of weakness of the left hand, decreased hearing and slurred speech since yesterday as well around the same time.  Admits to moderate headache. Has a history of tobacco use for the past 20 years, changed from cigarettes to dip 5 years ago. Family history of lung cancer in father in his 73s. He denies any night sweats, weight changes, cough, dysuria or hematuria or any other complaints at this time.   ED Course: Afebrile, hemodynamically stable, on room air. Notable Labs: Labs overall unremarkable. Notable Imaging: CT head without contrast-1.3 cm lesion of the right frontal lobe with a large amount of surrounding vasogenic edema consistent with intracranial metastatic disease with mass-effect on the right lateral ventricle but no midline shift.  Left hand XR unremarkable.  Patient received Decadron.    Vitals:   10/15/20 0809 10/15/20 0830  BP: 127/65 (!) 144/97  Pulse: 82 76  Resp: 18 18  Temp: 98.8 F (37.1 C) 98.2 F (36.8 C)  SpO2: 95% 97%     Review of Systems:  Review of Systems  All other systems reviewed and are negative.   Medical/Social/Family History   Past Medical History: Past Medical History:  Diagnosis Date  .  Cancer [facility])     History reviewed. No pertinent surgical history.  Medications: Prior to Admission medications   Not on File    Allergies:   Allergies  Allergen Reactions  . Penicillins Hives    Social History:  has no history on file for tobacco use, alcohol use, and drug use.  Family History: History reviewed. No pertinent family history.   Objective   Physical Exam: Blood pressure (!) 144/97, pulse 76, temperature 98.2 F (36.8 C), temperature source Oral, resp. rate 18, height 6' (1.829 m), weight 83.9 kg, SpO2 97 %.  Physical Exam Vitals and nursing note reviewed. Exam conducted with a chaperone present.  Constitutional:      General: He is not in acute distress.    Appearance: Normal appearance.  HENT:     Head: Normocephalic.     Mouth/Throat:     Mouth: Mucous membranes are moist.  Eyes:     Conjunctiva/sclera: Conjunctivae normal.     Pupils: Pupils are equal, round, and reactive to light.  Cardiovascular:     Rate and Rhythm: Normal rate and regular rhythm.  Pulmonary:     Effort: Pulmonary effort is normal.     Breath sounds: Normal breath sounds.  Abdominal:  General: Abdomen is flat. There is no distension.     Palpations: Abdomen is soft.  Musculoskeletal:        General: No swelling or tenderness.  Neurological:     Mental Status: He is alert.     Cranial Nerves: Dysarthria and facial asymmetry present.     Sensory: Sensation is intact.     Motor: Weakness present.     Comments: Left facial droop Left upper extremity pronator drift Decreased left hand grip strength     LABS on Admission: I have personally reviewed all the labs and imaging below    Basic Metabolic Panel: Recent Labs  Lab 10/15/20 0202  NA 137  K 4.0  CL 103  CO2 25  GLUCOSE 107*  BUN 16  CREATININE 0.90  CALCIUM 9.4   Liver Function Tests: No results for input(s): AST, ALT, ALKPHOS, BILITOT, PROT, ALBUMIN in the last 168 hours. No results for input(s):  LIPASE, AMYLASE in the last 168 hours. No results for input(s): AMMONIA in the last 168 hours. CBC: Recent Labs  Lab 10/15/20 0202  WBC 9.7  NEUTROABS 6.1  HGB 13.4  HCT 39.3  MCV 83.1  PLT 207   Cardiac Enzymes: No results for input(s): CKTOTAL, CKMB, CKMBINDEX, TROPONINI in the last 168 hours. BNP: Invalid input(s): POCBNP CBG: No results for input(s): GLUCAP in the last 168 hours.  Radiological Exams on Admission:  CT Head Wo Contrast  Result Date: 10/15/2020 CLINICAL DATA:  Left-sided weakness EXAM: CT HEAD WITHOUT CONTRAST TECHNIQUE: Contiguous axial images were obtained from the base of the skull through the vertex without intravenous contrast. COMPARISON:  None. FINDINGS: Brain: There is a 1.3 cm lesion in the right frontal lobe with a large amount of surrounding vasogenic edema. There is mass effect on the right lateral ventricle but no midline shift. No hemorrhage or extra-axial collection. Vascular: No hyperdense vessel or unexpected calcification. Skull: Normal. Negative for fracture or focal lesion. Sinuses/Orbits: No acute finding. Other: None. IMPRESSION: 1. 1.3 cm lesion in the right frontal lobe with a large amount of surrounding vasogenic edema, most consistent with intracranial metastatic disease. MRI of the brain with and without contrast is recommended for further characterization. 2. Mass effect on the right lateral ventricle but no midline shift. Electronically Signed   By: Ulyses Jarred M.D.   On: 10/15/2020 02:43   MR BRAIN W WO CONTRAST  Result Date: 10/15/2020 CLINICAL DATA:  60 year old male with left side weakness and abnormal right hemisphere on noncontrast head CT earlier today. EXAM: MRI HEAD WITHOUT AND WITH CONTRAST TECHNIQUE: Multiplanar, multiecho pulse sequences of the brain and surrounding structures were obtained without and with intravenous contrast. CONTRAST:  60m GADAVIST GADOBUTROL 1 MMOL/ML IV SOLN COMPARISON:  Head CT 0232 hours. FINDINGS: Brain:  Solitary round but mildly lobulated heterogeneously enhancing mass in the right middle frontal gyrus just above the frontal operculum encompasses 24 x 22 by 19 mm and has a 1 or more small feeding vessels visible (series 16 images 101 112. There is a large amount of regional abnormal T2 and FLAIR hyperintensity in a pattern most suggestive of vasogenic edema. Associated regional mass effect, including on the right lateral ventricle, but no midline shift. The edema is facilitated on diffusion but the enhancing lesion itself is heterogeneously T2 hypointense and diffusion restricted indicating hypercellularity (series 14, image 15). Mild hemosiderin also within the mass. No other abnormal intracranial enhancement or similar lesion elsewhere. No dural thickening. There is a chronic microhemorrhage  in the left inferior occipital lobe. Outside of the area of edema there is scattered mostly subcortical white matter T2 and FLAIR hyperintensity which is mild to moderate for age and in a nonspecific configuration. No restricted diffusion suggestive of acute infarction. No ventriculomegaly, extra-axial collection or acute intracranial hemorrhage. Cervicomedullary junction and pituitary are within normal limits. Vascular: Major intracranial vascular flow voids are preserved. The right vertebral artery appears dominant. Major dural venous sinuses are enhancing and appear to be patent. Skull and upper cervical spine: Negative visible cervical spine and spinal cord. Visualized bone marrow signal is within normal limits. Sinuses/Orbits: Negative. Other: Mastoids are clear. Visible internal auditory structures appear normal. Small benign right forehead scalp lipoma (series 12, image 108). IMPRESSION: 1. Solitary hypervascular and hypercellular appearing 2.4 cm enhancing mass in the right middle frontal gyrus with abundant regional vasogenic edema. Microhemorrhage within the mass. Mild regional mass effect, but no midline shift.  Differential considerations include solitary metastasis (especially a hypervascular met such as renal cell carcinoma), CNS lymphoma, and less likely high-grade glioma. Follow-up CT Chest, Abdomen, and Pelvis may be valuable. 2. Otherwise mild to moderate for age nonspecific cerebral white matter signal changes, most commonly due to chronic small vessel disease. Electronically Signed   By: Genevie Ann M.D.   On: 10/15/2020 10:16   CT CHEST ABDOMEN PELVIS W CONTRAST  Result Date: 10/15/2020 CLINICAL DATA:  Oncology staging. Probable metastasis on CT and MRI imaging of the brain. EXAM: CT CHEST, ABDOMEN, AND PELVIS WITH CONTRAST TECHNIQUE: Multidetector CT imaging of the chest, abdomen and pelvis was performed following the standard protocol during bolus administration of intravenous contrast. CONTRAST:  158m OMNIPAQUE IOHEXOL 300 MG/ML  SOLN COMPARISON:  None. FINDINGS: CT CHEST FINDINGS Cardiovascular: The thoracic aorta is normal. Central pulmonary arteries are unremarkable. Mild calcified atherosclerosis in the LAD. The heart size is normal. Mediastinum/Nodes: No enlarged mediastinal, hilar, or axillary lymph nodes. Thyroid gland, trachea, and esophagus demonstrate no significant findings. Lungs/Pleura: Central airways are normal. No pneumothorax. Numerous bilateral pulmonary nodules consistent with metastatic disease. A representative nodule in the right upper lobe measures up to 2.7 cm on series 4, image 62. There is either 1 snowman shaped mass or 2 adjacent masses in the medial left lung base on series 4, image 121. I would favor 2 adjacent masses. The larger of the 2 masses measures 2.7 cm. Musculoskeletal: No chest wall mass or suspicious bone lesions identified. CT ABDOMEN PELVIS FINDINGS Hepatobiliary: A tiny low-attenuation lesion is seen in the inferior right hepatic lobe on series 3, image 81, too small to characterize. A small benign lesion such as a cyst is favored. The liver and gallbladder are  otherwise normal. Portal veins are normal. Pancreas: Unremarkable. No pancreatic ductal dilatation or surrounding inflammatory changes. Spleen: Normal in size without focal abnormality. Adrenals/Urinary Tract: There is a nodule in the right adrenal gland on series 3, image 62 measuring up to 1.6 cm. There is a nodule in the left adrenal gland on series 3, image 60 measuring up to 2.1 cm. Slight decreased attenuation in the right lateral kidney with an apparent bulge in this location is worrisome for a subtle solid mass measuring approximately 1.9 cm. The right kidney is otherwise normal. The right ureter is normal. There is a large mass in the superior half of the left kidney measuring at least 11.3 by 10.3 by 9.8 cm. There may be a subtle mass off the inferior pole of the left kidney seen on coronal image  84 measuring 16 mm as well. No obvious tumor thrombus in the left renal vein. No hydronephrosis. The right ureter is normal. The bladder is normal. The left renal mass is hypervascular with multiple feeding vessels. There is also increased attenuation in the adjacent soft tissues. There may be invasion of the adjacent soft tissues as seen on coronal image 113 where tumor appears to abut the left crus. Stomach/Bowel: The stomach and small bowel are normal. The colon is normal. The appendix is not visualized but there is no secondary evidence of appendicitis. Vascular/Lymphatic: Mild calcified atherosclerosis is seen in the abdominal aorta, extending into the iliac vessels. Reproductive: Prostate is unremarkable. Other: No free air free fluid. Musculoskeletal: No acute or significant osseous findings. IMPRESSION: 1. There is a large mass measuring 11.3 x 10.3 x 9.8 cm centered in the upper half of the left kidney consistent with renal cell carcinoma. The mass is hypervascular with multiple feeding vessels. There may be extension of tumor into the surrounding soft tissues. Tumor appears to abut the left crus. There  is no definite vascular involvement associated with the renal artery or vein. Recommend MRI for complete evaluation. 2. There may be another small 16 mm solid mass in the lower pole of the left kidney which is suspicious for a second malignancy. Recommend attention on MRI. 3. There is suggestion of a small solid mass in the lower pole the right kidney which is suspicious for malignancy. Recommend attention on MRI. 4. Numerous bilateral pulmonary nodules consistent with metastatic disease. 5. Bilateral adrenal nodules worrisome for metastatic disease. 6. A tiny low-attenuation lesion in the inferior right hepatic lobe is nonspecific. Recommend attention on MRI. This is favored to represent a small benign cyst. 7. Mild calcified atherosclerosis in the thoracic and abdominal aorta. Electronically Signed   By: Dorise Bullion III M.D   On: 10/15/2020 11:05   DG Hand Complete Left  Result Date: 10/15/2020 CLINICAL DATA:  Left thumb weakness EXAM: LEFT HAND - COMPLETE 3+ VIEW COMPARISON:  None. FINDINGS: There is no evidence of fracture or dislocation. There is no evidence of arthropathy or other focal bone abnormality. Soft tissues are unremarkable. IMPRESSION: Negative. Electronically Signed   By: Ulyses Jarred M.D.   On: 10/15/2020 03:29       EKG: normal sinus rhythm, nonspecific ST and T waves changes   A & P   Active Problems:   Mass of right frontal lobe   Syncope   Tobacco use   1. Newly found right frontal lobe mass with vasogenic edema and mass effect concerning for malignancy and diffuse metastatic disease, unknown primary a. MRI brain with solitary hypervascular and hypercellular 2.4 cm enhancing mass in the right middle frontal gyrus with abundant vasogenic edema. Microhemorrhage within the mass with regional mass effect but no midline shift b. Having neurologic deficits: dysarthria, left facial droop, LUE weakness c. Discussed with Dr. Mickeal Skinner, neuro-oncology who recommended staging  CT d. CT chest/abd/pelvis: Large left kidney mass consistent with RCC and possible small solid mass in the lower pole of the left kidney which is suspicious for a second malignancy. Small solid mass in the lower pole of the right kidney. Numerous bilateral pulmonary nodules and Bilateral adrenal nodules consistent with metastatic disease. Small right inferior hepatic lobe lesion. Radiology recommending MRI for further evaluation - will order e. Continue decadron f. Oncology consulted, discussed with Dr. Irene Limbo, who recommended IR eval for renal biopsy and someone will see in the AM g. Discussed with  neurosurgery team who recommended transfer to [facility] and continue steroids  2. Syncopal episode a. Likely secondary to right frontal lobe mass and/or orthostatic hypotension b. Orthostatic vital signs when able c. telemetry  3. Tobacco use a. Recommend cessation   DVT prophylaxis: SCDs   Code Status: Full Code  Diet: regular Family Communication: Admission, patients condition and plan of care including tests being ordered have been discussed with the patient who indicates understanding and agrees with the plan and Code Status.    FYI: patient is under the custody of [place] [employer]. All communication/updates must go through them.    Disposition Plan: The appropriate patient status for this patient is INPATIENT. Inpatient status is judged to be reasonable and necessary in order to provide the required intensity of service to ensure the patient's safety. The patient's presenting symptoms, physical exam findings, and initial radiographic and laboratory data in the context of their chronic comorbidities is felt to place them at high risk for further clinical deterioration. Furthermore, it is not anticipated that the patient will be medically stable for discharge from the hospital within 2 midnights of admission. The following factors support the patient status of inpatient.   " The patient's presenting  symptoms include syncope, neurologic deficits. " The worrisome physical exam findings include dysarthria, left hand weakness. " The initial radiographic and laboratory data are worrisome because of as above. " The chronic co-morbidities include tobacco use.   * I certify that at the point of admission it is my clinical judgment that the patient will require inpatient hospital care spanning beyond 2 midnights from the point of admission due to high intensity of service, high risk for further deterioration and high frequency of surveillance required.*   Status is: Observation  The patient will require care spanning > 2 midnights and should be moved to inpatient because: Ongoing diagnostic testing needed not appropriate for outpatient work up, IV treatments appropriate due to intensity of illness or inability to take PO and Inpatient level of care appropriate due to severity of illness  Dispo: The patient is from: prison              Anticipated d/c is to: tbd              Patient currently is not medically stable to d/c.   Difficult to place patient No         The medical decision making on this patient was of high complexity and the patient is at high risk for clinical deterioration, therefore this is a level 3  admission.  Consultants  . Awaiting call back from neurology, neurosurgery, oncology . Discussed with neurooncology before CT results came back   Procedures  . none  Time Spent on Admission: 80 minutes    Harold Hedge, DO [facility]  10/15/2020, 11:29 AM

## 2020-10-16 ENCOUNTER — Ambulatory Visit
Admit: 2020-10-16 | Discharge: 2020-10-16 | Disposition: A | Discharge status: Home / Self Care | Source: Ambulatory Visit | Attending: Radiation Oncology | Admitting: Radiation Oncology

## 2020-10-16 ENCOUNTER — Ambulatory Visit
Admit: 2020-10-16 | Discharge: 2020-10-16 | Disposition: A | Discharge status: Home / Self Care | Attending: Radiation Oncology | Admitting: Radiation Oncology

## 2020-10-16 ENCOUNTER — Encounter (HOSPITAL_COMMUNITY): Payer: Self-pay | Admitting: Internal Medicine

## 2020-10-16 ENCOUNTER — Other Ambulatory Visit: Payer: Self-pay | Admitting: Neurological Surgery

## 2020-10-16 DIAGNOSIS — C7931 Secondary malignant neoplasm of brain: Secondary | ICD-10-CM | POA: Insufficient documentation

## 2020-10-16 DIAGNOSIS — N2889 Other specified disorders of kidney and ureter: Secondary | ICD-10-CM | POA: Diagnosis not present

## 2020-10-16 DIAGNOSIS — Z510 Encounter for antineoplastic radiation therapy: Secondary | ICD-10-CM | POA: Insufficient documentation

## 2020-10-16 DIAGNOSIS — C799 Secondary malignant neoplasm of unspecified site: Secondary | ICD-10-CM

## 2020-10-16 DIAGNOSIS — Z720 Tobacco use: Secondary | ICD-10-CM | POA: Diagnosis not present

## 2020-10-16 DIAGNOSIS — C642 Malignant neoplasm of left kidney, except renal pelvis: Secondary | ICD-10-CM

## 2020-10-16 DIAGNOSIS — R55 Syncope and collapse: Secondary | ICD-10-CM | POA: Diagnosis not present

## 2020-10-16 DIAGNOSIS — G9389 Other specified disorders of brain: Secondary | ICD-10-CM | POA: Diagnosis not present

## 2020-10-16 LAB — BASIC METABOLIC PANEL
Anion gap: 8 (ref 5–15)
BUN: 28 mg/dL — ABNORMAL HIGH (ref 8–23)
CO2: 23 mmol/L (ref 22–32)
Calcium: 9.3 mg/dL (ref 8.9–10.3)
Chloride: 104 mmol/L (ref 98–111)
Creatinine, Ser: 1.01 mg/dL (ref 0.61–1.24)
GFR, Estimated: >60 mL/min (ref >60)
Glucose, Bld: 140 mg/dL — ABNORMAL HIGH (ref 70–99)
Potassium: 5.4 mmol/L — ABNORMAL HIGH (ref 3.5–5.1)
Sodium: 135 mmol/L (ref 135–145)

## 2020-10-16 LAB — CBC
HCT: 39.7 % (ref 39.0–52.0)
Hemoglobin: 13.3 g/dL (ref 13.0–17.0)
MCH: 28.5 pg (ref 26.0–34.0)
MCHC: 33.5 g/dL (ref 30.0–36.0)
MCV: 85.2 fL (ref 80.0–100.0)
Platelets: 232 10*3/uL (ref 150–400)
RBC: 4.66 MIL/uL (ref 4.22–5.81)
RDW: 14.6 % (ref 11.5–15.5)
WBC: 17 10*3/uL — ABNORMAL HIGH (ref 4.0–10.5)
nRBC: 0 % (ref 0.0–0.2)

## 2020-10-16 MED ORDER — DEXAMETHASONE 4 MG PO TABS
4.0000 mg | ORAL_TABLET | Freq: Three times a day (TID) | ORAL | Status: DC
  Administered 2020-10-16 – 2020-10-22 (×18): 4 mg via ORAL
  Filled 2020-10-16 (×19): qty 1

## 2020-10-16 MED ORDER — LEVETIRACETAM 500 MG PO TABS
500.0000 mg | ORAL_TABLET | Freq: Two times a day (BID) | ORAL | Status: DC
  Administered 2020-10-16 – 2020-10-22 (×13): 500 mg via ORAL
  Filled 2020-10-16 (×13): qty 1

## 2020-10-16 NOTE — Consult Note (Signed)
Chief Complaint: Patient was seen in consultation today for image guided left renal mass biopsy Chief Complaint  Patient presents with  . Head Injury    Referring Physician(s): Segal,J/Moody,J  Supervising Physician: Markus Daft  Patient Status: [facility] - In-pt  History of Present Illness: Kenneth Grant is a 60 y.o. male smoker, currently incarcerated at the [facility] since February of this year, who was admitted to [facility] on 5/22 following episode of dizziness, seizure-like activity , syncopal episode along with weakness of the left arm and hand, decreased hearing and slurred speech/dysarthria.  MRI of the brain revealed:  1. Solitary hypervascular and hypercellular appearing 2.4 cm enhancing mass in the right middle frontal gyrus with abundant regional vasogenic edema. Microhemorrhage within the mass. Mild regional mass effect, but no midline shift. Differential considerations include solitary metastasis (especially a hypervascular met such as renal cell carcinoma), CNS lymphoma, and less likely high-grade glioma.  2. Otherwise mild to moderate for age nonspecific cerebral white matter signal changes, most commonly due to chronic small vessel Disease.  CT of the chest abdomen pelvis revealed:  1. There is a large mass measuring 11.3 x 10.3 x 9.8 cm centered in the upper half of the left kidney consistent with renal cell carcinoma. The mass is hypervascular with multiple feeding vessels. There may be extension of tumor into the surrounding soft tissues. Tumor appears to abut the left crus. There is no definite vascular involvement associated with the renal artery or vein. Recommend MRI for complete evaluation. 2. There may be another small 16 mm solid mass in the lower pole of the left kidney which is suspicious for a second malignancy. Recommend attention on MRI. 3. There is suggestion of a small solid mass in the lower pole the right kidney  which is suspicious for malignancy. Recommend attention on MRI. 4. Numerous bilateral pulmonary nodules consistent with metastatic disease. 5. Bilateral adrenal nodules worrisome for metastatic disease. 6. A tiny low-attenuation lesion in the inferior right hepatic lobe is nonspecific. Recommend attention on MRI. This is favored to represent a small benign cyst. 7. Mild calcified atherosclerosis in the thoracic and abdominal Aorta.  Current labs include WBC 17, hemoglobin 13.3, platelets 232k, potassium 5.4, creatinine 1.01 ,COVID-19 negative, HIV nonreactive.  Patient is tentatively scheduled for preop SRS with surgical resection of brain tumor later this week at [facility].  Request now received from primary care team and radiation oncology for image guided left renal mass biopsy for further evaluation.    Past Medical History:  Diagnosis Date  . Cancer [facility])     History reviewed. No pertinent surgical history.  Allergies: Penicillins  Medications: Prior to Admission medications   Not on File     History reviewed. No pertinent family history.  Social History   Socioeconomic History  . Marital status: Divorced    Spouse name: Not on file  . Number of children: Not on file  . Years of education: Not on file  . Highest education level: Not on file  Occupational History  . Not on file  Tobacco Use  . Smoking status: Not on file  . Smokeless tobacco: Not on file  Substance and Sexual Activity  . Alcohol use: Not on file  . Drug use: Not on file  . Sexual activity: Not on file  Other Topics Concern  . Not on file  Social History Narrative  . Not on file   Social Determinants of Health   Financial Resource  Strain: Not on file  Food Insecurity: Not on file  Transportation Needs: Not on file  Physical Activity: Not on file  Stress: Not on file  Social Connections: Not on file      Review of Systems currently denies fever, headache, chest pain, dyspnea,  cough, abdominal/back pain, nausea, vomiting or bleeding.  He does have some facial droop, dysarthria along with left arm/hand weakness  Vital Signs: BP 96/64 (BP Location: Right Arm)   Pulse 85   Temp 98.4 F (36.9 C) (Oral)   Resp 18   Ht 6' (1.829 m)   Wt 185 lb (83.9 kg)   SpO2 95%   BMI 25.09 kg/m   Physical Exam awake, alert.  Chest clear to auscultation bilaterally.  Heart with regular rate and rhythm.  Abdomen soft, positive bowel sounds, nontender.  No lower extremity edema.  Patient has reduced grip strength left hand and mild weakness left upper extremity, left facial droop along with dysarthria  Imaging: CT Head Wo Contrast  Result Date: 10/15/2020 CLINICAL DATA:  Left-sided weakness EXAM: CT HEAD WITHOUT CONTRAST TECHNIQUE: Contiguous axial images were obtained from the base of the skull through the vertex without intravenous contrast. COMPARISON:  None. FINDINGS: Brain: There is a 1.3 cm lesion in the right frontal lobe with a large amount of surrounding vasogenic edema. There is mass effect on the right lateral ventricle but no midline shift. No hemorrhage or extra-axial collection. Vascular: No hyperdense vessel or unexpected calcification. Skull: Normal. Negative for fracture or focal lesion. Sinuses/Orbits: No acute finding. Other: None. IMPRESSION: 1. 1.3 cm lesion in the right frontal lobe with a large amount of surrounding vasogenic edema, most consistent with intracranial metastatic disease. MRI of the brain with and without contrast is recommended for further characterization. 2. Mass effect on the right lateral ventricle but no midline shift. Electronically Signed   By: Ulyses Jarred M.D.   On: 10/15/2020 02:43   MR BRAIN W WO CONTRAST  Result Date: 10/15/2020 CLINICAL DATA:  60 year old male with left side weakness and abnormal right hemisphere on noncontrast head CT earlier today. EXAM: MRI HEAD WITHOUT AND WITH CONTRAST TECHNIQUE: Multiplanar, multiecho pulse  sequences of the brain and surrounding structures were obtained without and with intravenous contrast. CONTRAST:  20m GADAVIST GADOBUTROL 1 MMOL/ML IV SOLN COMPARISON:  Head CT 0232 hours. FINDINGS: Brain: Solitary round but mildly lobulated heterogeneously enhancing mass in the right middle frontal gyrus just above the frontal operculum encompasses 24 x 22 by 19 mm and has a 1 or more small feeding vessels visible (series 16 images 101 112. There is a large amount of regional abnormal T2 and FLAIR hyperintensity in a pattern most suggestive of vasogenic edema. Associated regional mass effect, including on the right lateral ventricle, but no midline shift. The edema is facilitated on diffusion but the enhancing lesion itself is heterogeneously T2 hypointense and diffusion restricted indicating hypercellularity (series 14, image 15). Mild hemosiderin also within the mass. No other abnormal intracranial enhancement or similar lesion elsewhere. No dural thickening. There is a chronic microhemorrhage in the left inferior occipital lobe. Outside of the area of edema there is scattered mostly subcortical white matter T2 and FLAIR hyperintensity which is mild to moderate for age and in a nonspecific configuration. No restricted diffusion suggestive of acute infarction. No ventriculomegaly, extra-axial collection or acute intracranial hemorrhage. Cervicomedullary junction and pituitary are within normal limits. Vascular: Major intracranial vascular flow voids are preserved. The right vertebral artery appears dominant. Major dural  venous sinuses are enhancing and appear to be patent. Skull and upper cervical spine: Negative visible cervical spine and spinal cord. Visualized bone marrow signal is within normal limits. Sinuses/Orbits: Negative. Other: Mastoids are clear. Visible internal auditory structures appear normal. Small benign right forehead scalp lipoma (series 12, image 108). IMPRESSION: 1. Solitary hypervascular and  hypercellular appearing 2.4 cm enhancing mass in the right middle frontal gyrus with abundant regional vasogenic edema. Microhemorrhage within the mass. Mild regional mass effect, but no midline shift. Differential considerations include solitary metastasis (especially a hypervascular met such as renal cell carcinoma), CNS lymphoma, and less likely high-grade glioma. Follow-up CT Chest, Abdomen, and Pelvis may be valuable. 2. Otherwise mild to moderate for age nonspecific cerebral white matter signal changes, most commonly due to chronic small vessel disease. Electronically Signed   By: Genevie Ann M.D.   On: 10/15/2020 10:16   CT CHEST ABDOMEN PELVIS W CONTRAST  Result Date: 10/15/2020 CLINICAL DATA:  Oncology staging. Probable metastasis on CT and MRI imaging of the brain. EXAM: CT CHEST, ABDOMEN, AND PELVIS WITH CONTRAST TECHNIQUE: Multidetector CT imaging of the chest, abdomen and pelvis was performed following the standard protocol during bolus administration of intravenous contrast. CONTRAST:  170m OMNIPAQUE IOHEXOL 300 MG/ML  SOLN COMPARISON:  None. FINDINGS: CT CHEST FINDINGS Cardiovascular: The thoracic aorta is normal. Central pulmonary arteries are unremarkable. Mild calcified atherosclerosis in the LAD. The heart size is normal. Mediastinum/Nodes: No enlarged mediastinal, hilar, or axillary lymph nodes. Thyroid gland, trachea, and esophagus demonstrate no significant findings. Lungs/Pleura: Central airways are normal. No pneumothorax. Numerous bilateral pulmonary nodules consistent with metastatic disease. A representative nodule in the right upper lobe measures up to 2.7 cm on series 4, image 62. There is either 1 snowman shaped mass or 2 adjacent masses in the medial left lung base on series 4, image 121. I would favor 2 adjacent masses. The larger of the 2 masses measures 2.7 cm. Musculoskeletal: No chest wall mass or suspicious bone lesions identified. CT ABDOMEN PELVIS FINDINGS Hepatobiliary: A  tiny low-attenuation lesion is seen in the inferior right hepatic lobe on series 3, image 81, too small to characterize. A small benign lesion such as a cyst is favored. The liver and gallbladder are otherwise normal. Portal veins are normal. Pancreas: Unremarkable. No pancreatic ductal dilatation or surrounding inflammatory changes. Spleen: Normal in size without focal abnormality. Adrenals/Urinary Tract: There is a nodule in the right adrenal gland on series 3, image 62 measuring up to 1.6 cm. There is a nodule in the left adrenal gland on series 3, image 60 measuring up to 2.1 cm. Slight decreased attenuation in the right lateral kidney with an apparent bulge in this location is worrisome for a subtle solid mass measuring approximately 1.9 cm. The right kidney is otherwise normal. The right ureter is normal. There is a large mass in the superior half of the left kidney measuring at least 11.3 by 10.3 by 9.8 cm. There may be a subtle mass off the inferior pole of the left kidney seen on coronal image 84 measuring 16 mm as well. No obvious tumor thrombus in the left renal vein. No hydronephrosis. The right ureter is normal. The bladder is normal. The left renal mass is hypervascular with multiple feeding vessels. There is also increased attenuation in the adjacent soft tissues. There may be invasion of the adjacent soft tissues as seen on coronal image 113 where tumor appears to abut the left crus. Stomach/Bowel: The stomach and small  bowel are normal. The colon is normal. The appendix is not visualized but there is no secondary evidence of appendicitis. Vascular/Lymphatic: Mild calcified atherosclerosis is seen in the abdominal aorta, extending into the iliac vessels. Reproductive: Prostate is unremarkable. Other: No free air free fluid. Musculoskeletal: No acute or significant osseous findings. IMPRESSION: 1. There is a large mass measuring 11.3 x 10.3 x 9.8 cm centered in the upper half of the left kidney  consistent with renal cell carcinoma. The mass is hypervascular with multiple feeding vessels. There may be extension of tumor into the surrounding soft tissues. Tumor appears to abut the left crus. There is no definite vascular involvement associated with the renal artery or vein. Recommend MRI for complete evaluation. 2. There may be another small 16 mm solid mass in the lower pole of the left kidney which is suspicious for a second malignancy. Recommend attention on MRI. 3. There is suggestion of a small solid mass in the lower pole the right kidney which is suspicious for malignancy. Recommend attention on MRI. 4. Numerous bilateral pulmonary nodules consistent with metastatic disease. 5. Bilateral adrenal nodules worrisome for metastatic disease. 6. A tiny low-attenuation lesion in the inferior right hepatic lobe is nonspecific. Recommend attention on MRI. This is favored to represent a small benign cyst. 7. Mild calcified atherosclerosis in the thoracic and abdominal aorta. Electronically Signed   By: Dorise Bullion III M.D   On: 10/15/2020 11:05   DG Hand Complete Left  Result Date: 10/15/2020 CLINICAL DATA:  Left thumb weakness EXAM: LEFT HAND - COMPLETE 3+ VIEW COMPARISON:  None. FINDINGS: There is no evidence of fracture or dislocation. There is no evidence of arthropathy or other focal bone abnormality. Soft tissues are unremarkable. IMPRESSION: Negative. Electronically Signed   By: Ulyses Jarred M.D.   On: 10/15/2020 03:29    Labs:  CBC: Recent Labs    10/15/20 0202 10/16/20 0605  WBC 9.7 17.0*  HGB 13.4 13.3  HCT 39.3 39.7  PLT 207 232    COAGS: No results for input(s): INR, APTT in the last 8760 hours.  BMP: Recent Labs    10/15/20 0202 10/16/20 0605  NA 137 135  K 4.0 5.4*  CL 103 104  CO2 25 23  GLUCOSE 107* 140*  BUN 16 28*  CALCIUM 9.4 9.3  CREATININE 0.90 1.01  GFRNONAA >60 >60    LIVER FUNCTION TESTS: No results for input(s): BILITOT, AST, ALT, ALKPHOS,  PROT, ALBUMIN in the last 8760 hours.  TUMOR MARKERS: No results for input(s): AFPTM, CEA, CA199, CHROMGRNA in the last 8760 hours.  Assessment and Plan: 60 y.o. male smoker, currently incarcerated at the [facility] since February of this year, who was admitted to [facility] on 5/22 following episode of dizziness, seizure-like activity , syncopal episode along with weakness of the left arm and hand, decreased hearing and slurred speech/dysarthria.  MRI of the brain revealed:  1. Solitary hypervascular and hypercellular appearing 2.4 cm enhancing mass in the right middle frontal gyrus with abundant regional vasogenic edema. Microhemorrhage within the mass. Mild regional mass effect, but no midline shift. Differential considerations include solitary metastasis (especially a hypervascular met such as renal cell carcinoma), CNS lymphoma, and less likely high-grade glioma.  2. Otherwise mild to moderate for age nonspecific cerebral white matter signal changes, most commonly due to chronic small vessel Disease.  CT of the chest abdomen pelvis revealed:  1. There is a large mass measuring 11.3 x 10.3 x 9.8  cm centered in the upper half of the left kidney consistent with renal cell carcinoma. The mass is hypervascular with multiple feeding vessels. There may be extension of tumor into the surrounding soft tissues. Tumor appears to abut the left crus. There is no definite vascular involvement associated with the renal artery or vein. Recommend MRI for complete evaluation. 2. There may be another small 16 mm solid mass in the lower pole of the left kidney which is suspicious for a second malignancy. Recommend attention on MRI. 3. There is suggestion of a small solid mass in the lower pole the right kidney which is suspicious for malignancy. Recommend attention on MRI. 4. Numerous bilateral pulmonary nodules consistent with metastatic disease. 5. Bilateral adrenal  nodules worrisome for metastatic disease. 6. A tiny low-attenuation lesion in the inferior right hepatic lobe is nonspecific. Recommend attention on MRI. This is favored to represent a small benign cyst. 7. Mild calcified atherosclerosis in the thoracic and abdominal Aorta.  Current labs include WBC 17, hemoglobin 13.3, platelets 232k, potassium 5.4, creatinine 1.01 ,COVID-19 negative, HIV nonreactive, PT/INR pending.  Patient is tentatively scheduled for preop SRS with surgical resection of brain tumor later this week at [facility].  Request now received from primary care team and radiation oncology for image guided left renal mass biopsy for further evaluation.  Imaging studies have been reviewed by Dr. Anselm Pancoast.Risks and benefits of procedure was discussed with the patient   including, but not limited to bleeding, infection, damage to adjacent structures or low yield requiring additional tests.  All of the questions were answered and there is agreement to proceed.  Consent signed and in chart.  Procedure scheduled for 5/24.    Thank you for this interesting consult.  I greatly enjoyed meeting Kenneth Grant and look forward to participating in their care.  A copy of this report was sent to the requesting provider on this date.  Electronically Signed: D. Rowe Robert, PA-C 10/16/2020, 1:56 PM   I spent a total of 25 minutes  in face to face in clinical consultation, greater than 50% of which was counseling/coordinating care for image guided left renal mass biopsy

## 2020-10-16 NOTE — Progress Notes (Signed)
Brief oncology note:  Request for consult received.  Chart has been reviewed and discussed with Dr. Irene Limbo and Dr. Alen Blew.  Note plan for transfer to [facility] with preop [facility] and surgical resection.  SRS appears to be scheduled for 5/26 and surgery scheduled for 5/27.  Based on scans, the patient appears to have metastatic renal cell carcinoma.  I met with the patient in his room.  Discussed imaging findings to date.  He is aware for plan to transfer to [facility] for preop SRS and then surgical resection.  No additional work-up recommended from medical oncology standpoint.  We will arrange for outpatient follow-up at the cancer center once he is discharged to discuss systemic treatment options.  He is agreeable to this plan.  I will continue to follow the chart peripherally and arrange for follow-up once discharge plan decided.  Mikey Bussing, DNP, AGPCNP-BC, AOCNP

## 2020-10-16 NOTE — Progress Notes (Signed)
PROGRESS NOTE    Kenneth Grant  OEH:[id number] DOB: [birth year] DOA: 10/15/2020 PCP: Pcp, No    Brief Narrative:  Kenneth Grant was admitted to the hospital with a newly found right frontal lobe mass with vasogenic edema and mass-effect in the setting of diffuse metastatic disease, unknown primary malignancy.  60 year old incarcerated male with no significant past medical history who was brought to the hospital after an episode of syncope with positive head trauma.  Apparently he was eating lunch, stood up and lost his consciousness.  Reported left hand weakness, decreased hearing and slurred speech and headache for about 24 hours.  On his initial physical examination blood pressure 144/97, heart rate 76, temperature 98.2, respiratory rate 18, oxygen saturation 97%.  His lungs are clear to auscultation bilaterally, heart S1-S2, present, rhythmic, soft abdomen, no lower extremity edema.  Patient had dysarthria and facial asymmetry.  Left facial droop.  Left upper extremity pronator drift, decreased left hand grip strength  Head CT with 1.3 cm lesion in the right frontal lobe, with large amount of surrounding vasogenic edema, most consistent with intracranial metastatic disease.  Mass-effect on the right lateral ventricle but no midline shift.  CT chest/ abdomen/ pelvis with a large mass 11.3 x 10.3 x 9.8 cm centered in the upper half of the left kidney consistent with renal cell carcinoma.  Mass is hypervascular and multiple feeding vessels.  There may be extension of the tumor into the surrounding soft tissues.  No vascular involvement of the renal artery or vein.  16 mm solid mass in the lower pole of the left kidney.  Small solid mass in the lower pole of the right kidney.  Numerous bilateral pulmonary nodules.  Bilateral adrenal nodules.  Small low-attenuation lesion in the right hepatic lobe.  Brain MRI with solitary hypervascular and hypercellular.  2.4 cm density mass in the right middle frontal  gyrus with abundant regional vasogenic edema.  Microhemorrhage within the mass.  Mild regional mass-effect but no midline shift.  Assessment & Plan:   Principal Problem:   Left renal mass Active Problems:   Mass of right frontal lobe   Syncope   Tobacco use   Metastatic neoplastic disease (HCC)  1. Large left renal mass, very high likelihood for malignancy/ diffuse metastatic disease lungs, adrenal, liver and brain.   Plan for IR guided biopsy of left renal mass.  For brain lesion plan for surgical resection with pre-operative SRS.   Continue supportive medical care, patient with no nausea or vomiting, no headache or abdominal pain. Renal function stable.   2. Syncope possible seizures. Considering brain lesion and local edema, high risk for seizures. Will place patient on Keppra for seizure prophylactic, continue with dexamethasone.   3. Tobacco abuse. Smoking cessation.     Status is: Inpatient  Remains inpatient appropriate because:Inpatient level of care appropriate due to severity of illness   Dispo: The patient is from: Home              Anticipated d/c is to: Home              Patient currently is not medically stable to d/c.   Difficult to place patient No   DVT prophylaxis: scd   Code Status:   full  Family Communication:  No family at the bedside     Consultants:   IR  Neurosurgery  Oncology / neuro oncology     Subjective: Patient is feeling well this am, but not yet back to baseline,  continue to have left facial droop. No nausea or vomiting, no headache.   Objective: Vitals:   10/15/20 1507 10/15/20 2015 10/16/20 0008 10/16/20 0601  BP: 126/71 132/74 139/76 96/64  Pulse: 93 96 98 85  Resp:  18 18 18   Temp: 97.6 F (36.4 C) 98.9 F (37.2 C) 98.3 F (36.8 C) 98.4 F (36.9 C)  TempSrc: Oral Oral Oral Oral  SpO2: 94% 98% 96% 95%  Weight:      Height:        Intake/Output Summary (Last 24 hours) at 10/16/2020 1447 Last data filed at  10/16/2020 0600 Gross per 24 hour  Intake 480 ml  Output --  Net 480 ml   Filed Weights   10/15/20 0100  Weight: 83.9 kg    Examination:   General: Not in pain or dyspnea, deconditioned  Neurology: Awake and alert, left facial droop.  E ENT: mild pallor, no icterus, oral mucosa moist Cardiovascular: No JVD. S1-S2 present, rhythmic, no gallops, rubs, or murmurs. No lower extremity edema. Pulmonary: positive breath sounds bilaterally, adequate air movement, no wheezing, rhonchi or rales. Gastrointestinal. Abdomen soft and non tender Skin. No rashes Musculoskeletal: no joint deformities     Data Reviewed: I have personally reviewed following labs and imaging studies  CBC: Recent Labs  Lab 10/15/20 0202 10/16/20 0605  WBC 9.7 17.0*  NEUTROABS 6.1  --   HGB 13.4 13.3  HCT 39.3 39.7  MCV 83.1 85.2  PLT 207 983   Basic Metabolic Panel: Recent Labs  Lab 10/15/20 0202 10/16/20 0605  NA 137 135  K 4.0 5.4*  CL 103 104  CO2 25 23  GLUCOSE 107* 140*  BUN 16 28*  CREATININE 0.90 1.01  CALCIUM 9.4 9.3   GFR: Estimated Creatinine Clearance: 84.3 mL/min (by C-G formula based on SCr of 1.01 mg/dL). Liver Function Tests: No results for input(s): AST, ALT, ALKPHOS, BILITOT, PROT, ALBUMIN in the last 168 hours. No results for input(s): LIPASE, AMYLASE in the last 168 hours. No results for input(s): AMMONIA in the last 168 hours. Coagulation Profile: No results for input(s): INR, PROTIME in the last 168 hours. Cardiac Enzymes: No results for input(s): CKTOTAL, CKMB, CKMBINDEX, TROPONINI in the last 168 hours. BNP (last 3 results) No results for input(s): PROBNP in the last 8760 hours. HbA1C: No results for input(s): HGBA1C in the last 72 hours. CBG: No results for input(s): GLUCAP in the last 168 hours. Lipid Profile: No results for input(s): CHOL, HDL, LDLCALC, TRIG, CHOLHDL, LDLDIRECT in the last 72 hours. Thyroid Function Tests: No results for input(s): TSH,  T4TOTAL, FREET4, T3FREE, THYROIDAB in the last 72 hours. Anemia Panel: No results for input(s): VITAMINB12, FOLATE, FERRITIN, TIBC, IRON, RETICCTPCT in the last 72 hours.    Radiology Studies: I have reviewed all of the imaging during this hospital visit personally     Scheduled Meds: . dexamethasone  4 mg Oral Q8H  . levETIRAcetam  500 mg Oral BID  . sodium chloride flush  3 mL Intravenous Q12H   Continuous Infusions:   LOS: 1 day        Anika Shore Gerome Apley, MD

## 2020-10-16 NOTE — Consult Note (Signed)
Pt was off the floor when I came by to see him. Called re new brain mass in the setting of syncopal episode / LOC with left hand weakness and dysarthria. MRI reviewed, has R convexity solitary enhancing mass with significant edema. CT CAP with likely RCC primary.  -recommend preop SRS with surgical resection, plan as of now is [facility] Thursday 5/26 followed by surgery 5/27

## 2020-10-16 NOTE — Consult Note (Signed)
Radiation Oncology         [phone] ________________________________  Name: Kenneth Grant        MRN: [id number]  Date of Service: 10/16/20 DOB: [birth year]  CC:Pcp, No  REFERRING PHYSICIAN: Dr. Zada Finders  DIAGNOSIS: The primary encounter diagnosis was Brain mass. Diagnoses of Injury of left thumb, initial encounter, Syncope, and Renal mass were also pertinent to this visit.   HISTORY OF PRESENT ILLNESS: Kenneth Grant is a 60 y.o. male seen at the request of Dr. Zada Finders for a solitary brain metastasis. The patient is in [facility] and after eating fell to the floor and was brought to the ED. The patient describes tonic clonic seizure, and bit his tongue. Since he has had left hand and arm weakness and difficulty with left facial drooping. A CT head without contrast yesterday showed a  1.3 cm lesion in the right frontal lobe with large surrounding edema.  An MRI of the brain with and without contrast then showed a solitary brain metastasis measuring up to 2.4 cm in the right middle frontal gyrus with abundant regional vasogenic edema, no other lesions were found.  CT chest abdomen pelvis with contrast also was performed showing an 11.3 cm mass in the left kidney concerning for renal cell neoplasm there was a small mass in the pole of the left kidney that was also suspicious for secondary deposit, bilateral pulmonary nodules consistent with metastatic disease, a right renal lesion, and a cystic change in the the right hepatic lobe.  He is currently at [facility] but is being moved to [facility] so that he can meet with neurosurgery and to proceed with CT-guided biopsy of the left kidney.  Given the findings in the brain he is seen to discuss treatment recommendations.  In brain oncology conference this morning his case was presented and it was recommended that he be considered for preoperative the stereotactic radiosurgery.     PREVIOUS RADIATION THERAPY: No   PAST MEDICAL HISTORY:   Past Medical History:  Diagnosis Date  . Cancer ([place])        PAST SURGICAL HISTORY:History reviewed. No pertinent surgical history.   FAMILY HISTORY: History reviewed. No pertinent family history.   SOCIAL HISTORY:   The patient is divorced but still in a relationship with his former wife and they consider their relationship to still be as though they are married. 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 He is in federal custody on drug related charges. He has been in the [facility] since February 2022.    ALLERGIES: Penicillins   MEDICATIONS:  Current Facility-Administered Medications  Medication Dose Route Frequency Provider Last Rate Last Admin  . acetaminophen (TYLENOL) tablet 650 mg  650 mg Oral Q6H PRN Harold Hedge, MD       Or  . acetaminophen (TYLENOL) suppository 650 mg  650 mg Rectal Q6H PRN Harold Hedge, MD      . dexamethasone (DECADRON) injection 4 mg  4 mg Intravenous Q6H Harold Hedge, MD   4 mg at 10/16/20 [phone]  . LORazepam (ATIVAN) injection 0.5 mg  0.5 mg Intravenous Q6H PRN Harold Hedge, MD      . polyethylene glycol (MIRALAX / GLYCOLAX) packet 17 g  17 g Oral Daily PRN Harold Hedge, MD      . sodium chloride flush (NS) 0.9 % injection 3 mL  3 mL Intravenous Q12H Harold Hedge, MD   3 mL at 10/16/20 0016  REVIEW OF SYSTEMS: On review of systems, the patient reports that he is doing well overall. He feels his speech is garbled and that his left hand is weak. He denies headaches, blurred vision, difficulty with walking or other episodes as he presented with. No other complaints are noted.      PHYSICAL EXAM:  Wt Readings from Last 3 Encounters:  10/15/20 185 lb (83.9 kg)   Temp Readings from Last 3 Encounters:  10/16/20 98.4 F (36.9 C) (Oral)   BP Readings from Last 3 Encounters:  10/16/20 96/64   Pulse Readings from Last 3 Encounters:  10/16/20 85   Pain Assessment Pain Score: 0-No pain/10  In general this is a well  appearing caucasian male in no acute distress. He's alert and oriented x4 and appropriate throughout the examination. Cardiopulmonary assessment is negative for acute distress and he exhibits normal effort. He has reduced grip strength and otherwise 4/5 strength in the upper left extremity, otherwise right upper extremity and bilateral lower extremities were 5/5. He has a bite mark on his right lateral tongue from his injury.   ECOG = 0  0 - Asymptomatic (Fully active, able to carry on all predisease activities without restriction)  1 - Symptomatic but completely ambulatory (Restricted in physically strenuous activity but ambulatory and able to carry out work of a light or sedentary nature. For example, light housework, office work)  2 - Symptomatic, <50% in bed during the day (Ambulatory and capable of all self care but unable to carry out any work activities. Up and about more than 50% of waking hours)  3 - Symptomatic, >50% in bed, but not bedbound (Capable of only limited self-care, confined to bed or chair 50% or more of waking hours)  4 - Bedbound (Completely disabled. Cannot carry on any self-care. Totally confined to bed or chair)  5 - Death   Eustace Pen MM, Creech RH, Tormey DC, et al. [phone]). "Toxicity and response criteria of the [facility]". [place] Oncol. 5 (6): 649-55    LABORATORY DATA:  Lab Results  Component Value Date   WBC 17.0 (H) 10/16/2020   HGB 13.3 10/16/2020   HCT 39.7 10/16/2020   MCV 85.2 10/16/2020   PLT 232 10/16/2020   Lab Results  Component Value Date   NA 135 10/16/2020   K 5.4 (H) 10/16/2020   CL 104 10/16/2020   CO2 23 10/16/2020   No results found for: ALT, AST, GGT, ALKPHOS, BILITOT    RADIOGRAPHY: CT Head Wo Contrast  Result Date: 10/15/2020 CLINICAL DATA:  Left-sided weakness EXAM: CT HEAD WITHOUT CONTRAST TECHNIQUE: Contiguous axial images were obtained from the base of the skull through the vertex without  intravenous contrast. COMPARISON:  None. FINDINGS: Brain: There is a 1.3 cm lesion in the right frontal lobe with a large amount of surrounding vasogenic edema. There is mass effect on the right lateral ventricle but no midline shift. No hemorrhage or extra-axial collection. Vascular: No hyperdense vessel or unexpected calcification. Skull: Normal. Negative for fracture or focal lesion. Sinuses/Orbits: No acute finding. Other: None. IMPRESSION: 1. 1.3 cm lesion in the right frontal lobe with a large amount of surrounding vasogenic edema, most consistent with intracranial metastatic disease. MRI of the brain with and without contrast is recommended for further characterization. 2. Mass effect on the right lateral ventricle but no midline shift. Electronically Signed   By: Ulyses Jarred M.D.   On: 10/15/2020 02:43   MR BRAIN W WO  CONTRAST  Result Date: 10/15/2020 CLINICAL DATA:  60 year old male with left side weakness and abnormal right hemisphere on noncontrast head CT earlier today. EXAM: MRI HEAD WITHOUT AND WITH CONTRAST TECHNIQUE: Multiplanar, multiecho pulse sequences of the brain and surrounding structures were obtained without and with intravenous contrast. CONTRAST:  69m GADAVIST GADOBUTROL 1 MMOL/ML IV SOLN COMPARISON:  Head CT 0232 hours. FINDINGS: Brain: Solitary round but mildly lobulated heterogeneously enhancing mass in the right middle frontal gyrus just above the frontal operculum encompasses 24 x 22 by 19 mm and has a 1 or more small feeding vessels visible (series 16 images 101 112. There is a large amount of regional abnormal T2 and FLAIR hyperintensity in a pattern most suggestive of vasogenic edema. Associated regional mass effect, including on the right lateral ventricle, but no midline shift. The edema is facilitated on diffusion but the enhancing lesion itself is heterogeneously T2 hypointense and diffusion restricted indicating hypercellularity (series 14, image 15). Mild hemosiderin  also within the mass. No other abnormal intracranial enhancement or similar lesion elsewhere. No dural thickening. There is a chronic microhemorrhage in the left inferior occipital lobe. Outside of the area of edema there is scattered mostly subcortical white matter T2 and FLAIR hyperintensity which is mild to moderate for age and in a nonspecific configuration. No restricted diffusion suggestive of acute infarction. No ventriculomegaly, extra-axial collection or acute intracranial hemorrhage. Cervicomedullary junction and pituitary are within normal limits. Vascular: Major intracranial vascular flow voids are preserved. The right vertebral artery appears dominant. Major dural venous sinuses are enhancing and appear to be patent. Skull and upper cervical spine: Negative visible cervical spine and spinal cord. Visualized bone marrow signal is within normal limits. Sinuses/Orbits: Negative. Other: Mastoids are clear. Visible internal auditory structures appear normal. Small benign right forehead scalp lipoma (series 12, image 108). IMPRESSION: 1. Solitary hypervascular and hypercellular appearing 2.4 cm enhancing mass in the right middle frontal gyrus with abundant regional vasogenic edema. Microhemorrhage within the mass. Mild regional mass effect, but no midline shift. Differential considerations include solitary metastasis (especially a hypervascular met such as renal cell carcinoma), CNS lymphoma, and less likely high-grade glioma. Follow-up CT Chest, Abdomen, and Pelvis may be valuable. 2. Otherwise mild to moderate for age nonspecific cerebral white matter signal changes, most commonly due to chronic small vessel disease. Electronically Signed   By: HGenevie AnnM.D.   On: 10/15/2020 10:16   CT CHEST ABDOMEN PELVIS W CONTRAST  Result Date: 10/15/2020 CLINICAL DATA:  Oncology staging. Probable metastasis on CT and MRI imaging of the brain. EXAM: CT CHEST, ABDOMEN, AND PELVIS WITH CONTRAST TECHNIQUE: Multidetector  CT imaging of the chest, abdomen and pelvis was performed following the standard protocol during bolus administration of intravenous contrast. CONTRAST:  1069mOMNIPAQUE IOHEXOL 300 MG/ML  SOLN COMPARISON:  None. FINDINGS: CT CHEST FINDINGS Cardiovascular: The thoracic aorta is normal. Central pulmonary arteries are unremarkable. Mild calcified atherosclerosis in the LAD. The heart size is normal. Mediastinum/Nodes: No enlarged mediastinal, hilar, or axillary lymph nodes. Thyroid gland, trachea, and esophagus demonstrate no significant findings. Lungs/Pleura: Central airways are normal. No pneumothorax. Numerous bilateral pulmonary nodules consistent with metastatic disease. A representative nodule in the right upper lobe measures up to 2.7 cm on series 4, image 62. There is either 1 snowman shaped mass or 2 adjacent masses in the medial left lung base on series 4, image 121. I would favor 2 adjacent masses. The larger of the 2 masses measures 2.7 cm. Musculoskeletal: No chest  wall mass or suspicious bone lesions identified. CT ABDOMEN PELVIS FINDINGS Hepatobiliary: A tiny low-attenuation lesion is seen in the inferior right hepatic lobe on series 3, image 81, too small to characterize. A small benign lesion such as a cyst is favored. The liver and gallbladder are otherwise normal. Portal veins are normal. Pancreas: Unremarkable. No pancreatic ductal dilatation or surrounding inflammatory changes. Spleen: Normal in size without focal abnormality. Adrenals/Urinary Tract: There is a nodule in the right adrenal gland on series 3, image 62 measuring up to 1.6 cm. There is a nodule in the left adrenal gland on series 3, image 60 measuring up to 2.1 cm. Slight decreased attenuation in the right lateral kidney with an apparent bulge in this location is worrisome for a subtle solid mass measuring approximately 1.9 cm. The right kidney is otherwise normal. The right ureter is normal. There is a large mass in the superior  half of the left kidney measuring at least 11.3 by 10.3 by 9.8 cm. There may be a subtle mass off the inferior pole of the left kidney seen on coronal image 84 measuring 16 mm as well. No obvious tumor thrombus in the left renal vein. No hydronephrosis. The right ureter is normal. The bladder is normal. The left renal mass is hypervascular with multiple feeding vessels. There is also increased attenuation in the adjacent soft tissues. There may be invasion of the adjacent soft tissues as seen on coronal image 113 where tumor appears to abut the left crus. Stomach/Bowel: The stomach and small bowel are normal. The colon is normal. The appendix is not visualized but there is no secondary evidence of appendicitis. Vascular/Lymphatic: Mild calcified atherosclerosis is seen in the abdominal aorta, extending into the iliac vessels. Reproductive: Prostate is unremarkable. Other: No free air free fluid. Musculoskeletal: No acute or significant osseous findings. IMPRESSION: 1. There is a large mass measuring 11.3 x 10.3 x 9.8 cm centered in the upper half of the left kidney consistent with renal cell carcinoma. The mass is hypervascular with multiple feeding vessels. There may be extension of tumor into the surrounding soft tissues. Tumor appears to abut the left crus. There is no definite vascular involvement associated with the renal artery or vein. Recommend MRI for complete evaluation. 2. There may be another small 16 mm solid mass in the lower pole of the left kidney which is suspicious for a second malignancy. Recommend attention on MRI. 3. There is suggestion of a small solid mass in the lower pole the right kidney which is suspicious for malignancy. Recommend attention on MRI. 4. Numerous bilateral pulmonary nodules consistent with metastatic disease. 5. Bilateral adrenal nodules worrisome for metastatic disease. 6. A tiny low-attenuation lesion in the inferior right hepatic lobe is nonspecific. Recommend attention  on MRI. This is favored to represent a small benign cyst. 7. Mild calcified atherosclerosis in the thoracic and abdominal aorta. Electronically Signed   By: Dorise Bullion III M.D   On: 10/15/2020 11:05   DG Hand Complete Left  Result Date: 10/15/2020 CLINICAL DATA:  Left thumb weakness EXAM: LEFT HAND - COMPLETE 3+ VIEW COMPARISON:  None. FINDINGS: There is no evidence of fracture or dislocation. There is no evidence of arthropathy or other focal bone abnormality. Soft tissues are unremarkable. IMPRESSION: Negative. Electronically Signed   By: Ulyses Jarred M.D.   On: 10/15/2020 03:29       IMPRESSION/PLAN: 1. 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 Dr. Lisbeth Renshaw has reviewed the patient's  case and I discussed in the presence of [place], the imaging findings and discussion of his case in multidisciplinary brain oncology conference. He is a good candidate for preoperative stereotactic radiosurgery. Dr. Zada Finders will meet with the patient as he will go to [facility] for evaluation and to proceed with 3T MRI brain and renal biopsy.  We discussed the risks, benefits, short, and long term effects of radiotherapy, as well as the curative intent, and the patient is interested in proceeding. Dr. Lisbeth Renshaw anticipates a course of one fraction of radiotherapy. Written consent is obtained and placed in the chart, a copy was provided to the patient. Simulation will take place this morning.   In a visit lasting 70 minutes, greater than 50% of the time was spent face to face and in floor time discussing the patient's condition, in preparation for the discussion, and coordinating the patient's care.     Carola Rhine, [facility]   **Disclaimer: This note was dictated with voice recognition software. Similar sounding words can inadvertently be transcribed and this note may contain transcription errors which may not have been corrected upon publication of note.**

## 2020-10-17 ENCOUNTER — Telehealth: Payer: Self-pay | Admitting: Radiation Therapy

## 2020-10-17 ENCOUNTER — Ambulatory Visit (HOSPITAL_COMMUNITY)
Admit: 2020-10-17 | Discharge: 2020-10-17 | Disposition: A | Discharge status: Home / Self Care | Attending: Radiation Oncology | Admitting: Radiation Oncology

## 2020-10-17 ENCOUNTER — Inpatient Hospital Stay (HOSPITAL_COMMUNITY)

## 2020-10-17 DIAGNOSIS — R55 Syncope and collapse: Secondary | ICD-10-CM | POA: Diagnosis not present

## 2020-10-17 DIAGNOSIS — N2889 Other specified disorders of kidney and ureter: Secondary | ICD-10-CM | POA: Diagnosis not present

## 2020-10-17 DIAGNOSIS — G9389 Other specified disorders of brain: Secondary | ICD-10-CM | POA: Diagnosis not present

## 2020-10-17 DIAGNOSIS — Z720 Tobacco use: Secondary | ICD-10-CM | POA: Diagnosis not present

## 2020-10-17 LAB — BASIC METABOLIC PANEL
Anion gap: 8 (ref 5–15)
BUN: 32 mg/dL — ABNORMAL HIGH (ref 8–23)
CO2: 24 mmol/L (ref 22–32)
Calcium: 9.2 mg/dL (ref 8.9–10.3)
Chloride: 105 mmol/L (ref 98–111)
Creatinine, Ser: 0.88 mg/dL (ref 0.61–1.24)
GFR, Estimated: >60 mL/min (ref >60)
Glucose, Bld: 144 mg/dL — ABNORMAL HIGH (ref 70–99)
Potassium: 4.7 mmol/L (ref 3.5–5.1)
Sodium: 137 mmol/L (ref 135–145)

## 2020-10-17 LAB — CBC
HCT: 38.4 % — ABNORMAL LOW (ref 39.0–52.0)
Hemoglobin: 12.8 g/dL — ABNORMAL LOW (ref 13.0–17.0)
MCH: 28.4 pg (ref 26.0–34.0)
MCHC: 33.3 g/dL (ref 30.0–36.0)
MCV: 85.3 fL (ref 80.0–100.0)
Platelets: 221 10*3/uL (ref 150–400)
RBC: 4.5 MIL/uL (ref 4.22–5.81)
RDW: 14.6 % (ref 11.5–15.5)
WBC: 17.1 10*3/uL — ABNORMAL HIGH (ref 4.0–10.5)
nRBC: 0 % (ref 0.0–0.2)

## 2020-10-17 LAB — PROTIME-INR
INR: 1 (ref 0.8–1.2)
Prothrombin Time: 13.1 seconds (ref 11.4–15.2)

## 2020-10-17 IMAGING — MR MR HEAD WO/W CM
13 of 21 series · 22 of 48 positions shown · IV contrast (gadavist)
Comparison: [DATE]

CLINICAL DATA: Right frontal lobe mass

EXAM:
MRI HEAD WITHOUT AND WITH CONTRAST
TECHNIQUE: Multiplanar, multiecho pulse sequences of the brain and surrounding
structures were obtained without and with intravenous contrast.
CONTRAST:  8mL GADAVIST GADOBUTROL 1 MMOL/ML IV SOLN

[Series 2: ax dti · axial · 3.0mm · 0.94mm/px · z∈[-53,+61]mm · 6 of 1404 slices shown]
[im 1/1404]
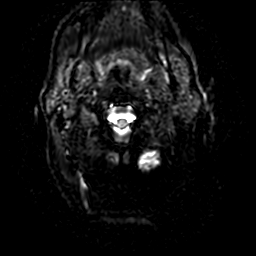
[im 201/1404]
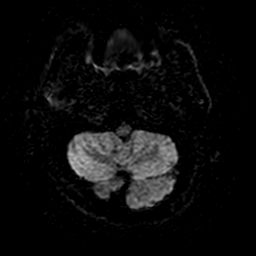
[im 401/1404]
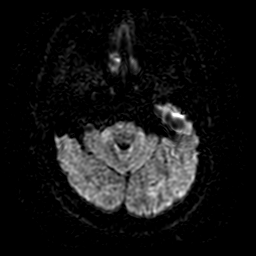
[im 602/1404]
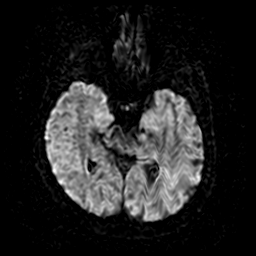
[im 802/1404]
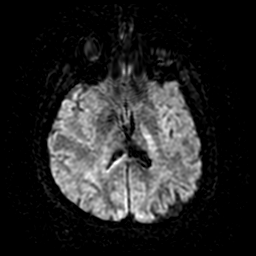
[im 1003/1404]
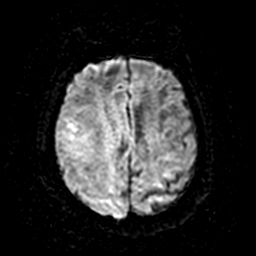

[Series 5: FLAIR · sagittal · 3.0mm · 0.47mm/px · 1 of 42 slices shown (1 of 2)]
[im 1/42]
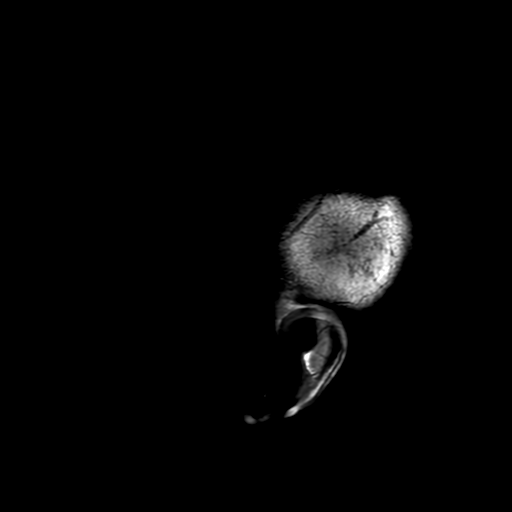

[Series 6: DWI · axial · 3.0mm · 0.94mm/px · 1 of 116 slices shown]
[im 1/116]
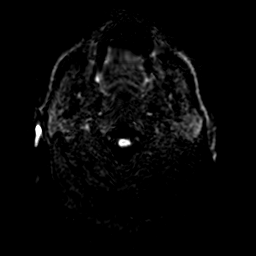

[Series 8: SWI · axial · 3.0mm · 0.47mm/px · 1 of 116 slices shown (1 of 2)]
[im 1/116]
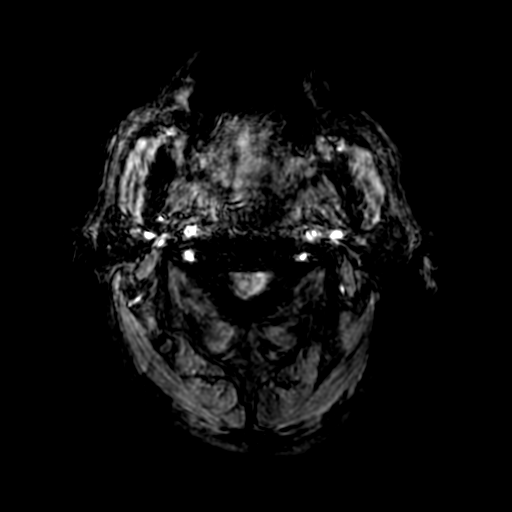

[Series 10: FLAIR · axial · 3.0mm · 0.47mm/px · 1 of 59 slices shown (2 of 2)]
[im 1/59]
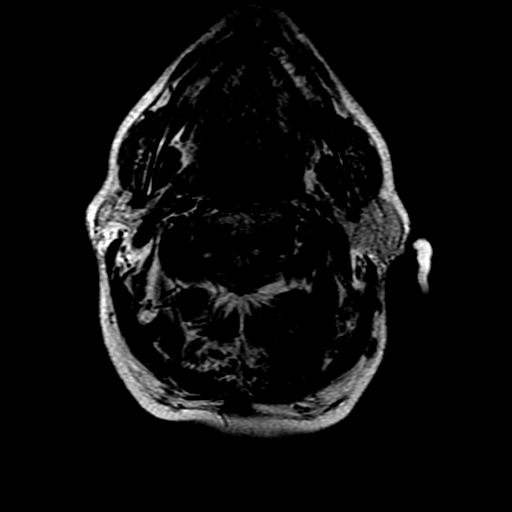

[Series 11: T2 post-contrast · coronal · 3.0mm · 0.39mm/px · 1 of 45 slices shown]
[im 1/45]
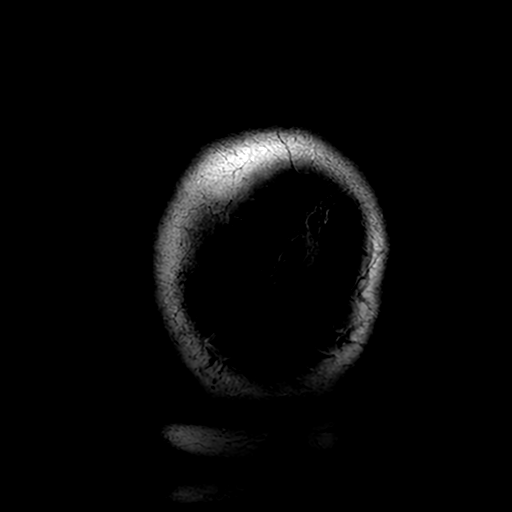

[Series 12: T2 · axial · 5.0mm · 0.23mm/px · 1 of 26 slices shown]
[im 1/26]
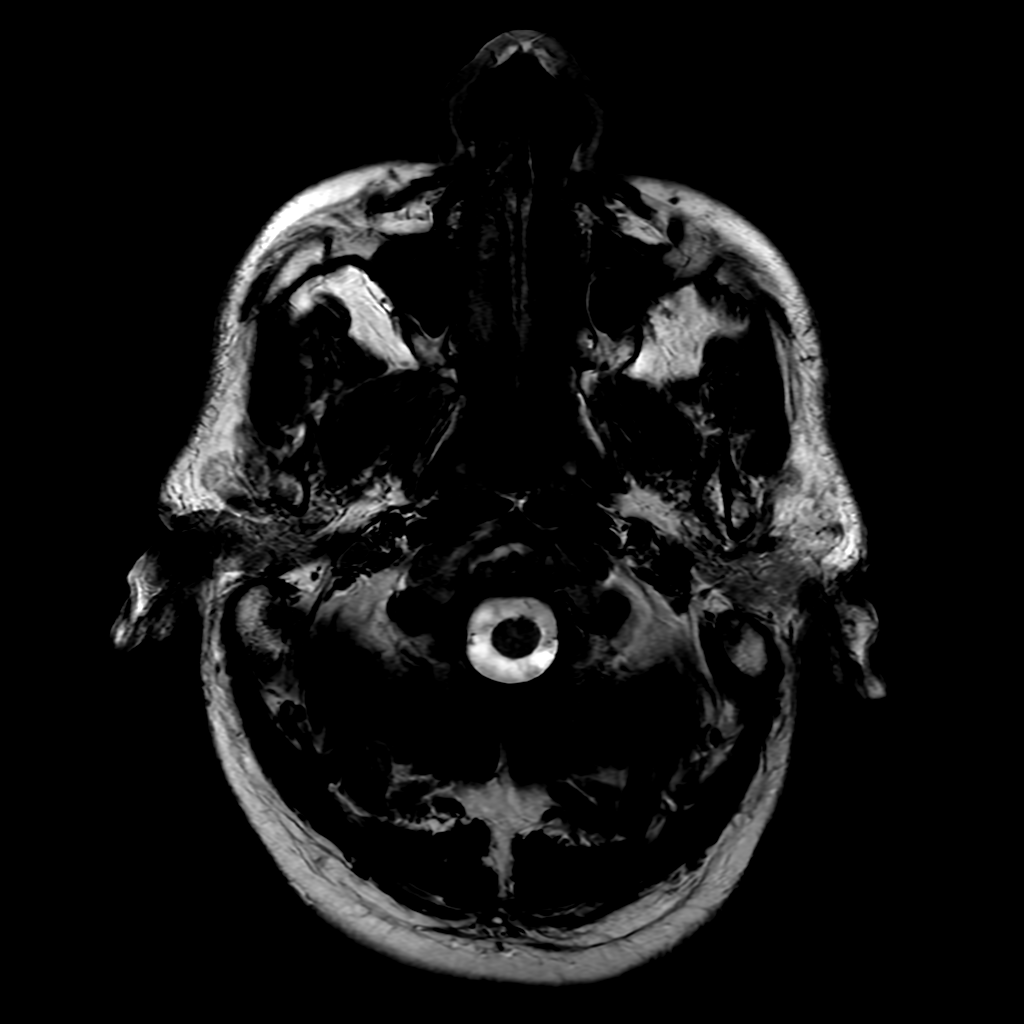

[Series 13: T1 post-contrast · coronal · 3.0mm · 0.43mm/px · 1 of 45 slices shown (1 of 2)]
[im 1/45]
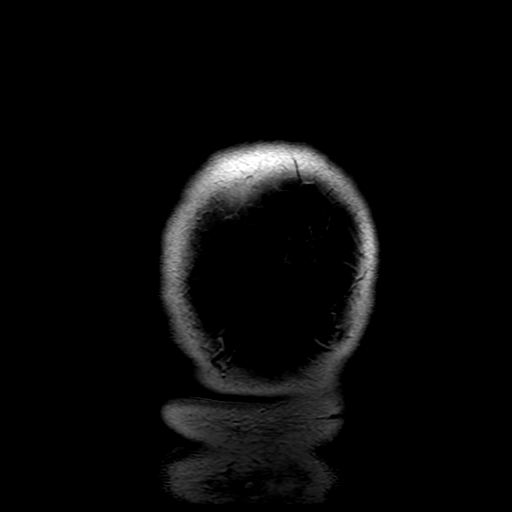

[Series 14: FLAIR post-contrast · sagittal · 3.0mm · 0.47mm/px · 1 of 42 slices shown]
[im 1/42]
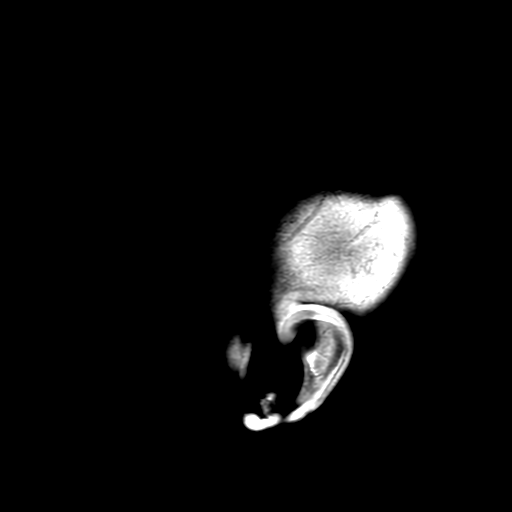

[Series 650: ADC · axial · 3.0mm · 0.94mm/px · 1 of 57 slices shown]
[im 1/57]
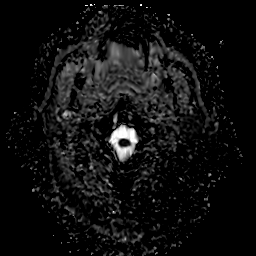

[Series 800: SWI · axial · 3.0mm · 0.47mm/px · 1 of 116 slices shown (2 of 2)]
[im 1/116]
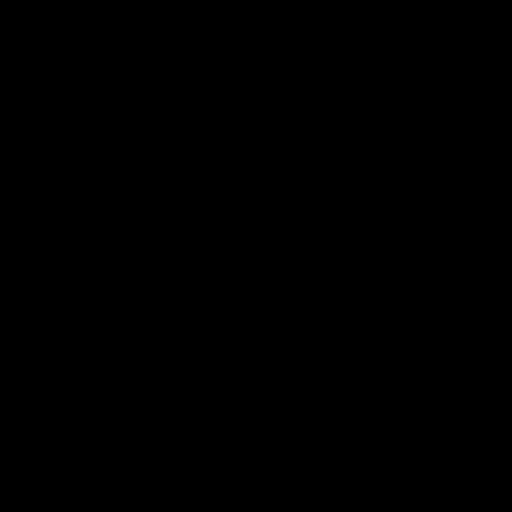

[Series 900: T1 · axial · non-contrast · 0.9mm · 0.50mm/px · z∈[-120,+135]mm · 3 of 301 slices shown]
[im 1/301]
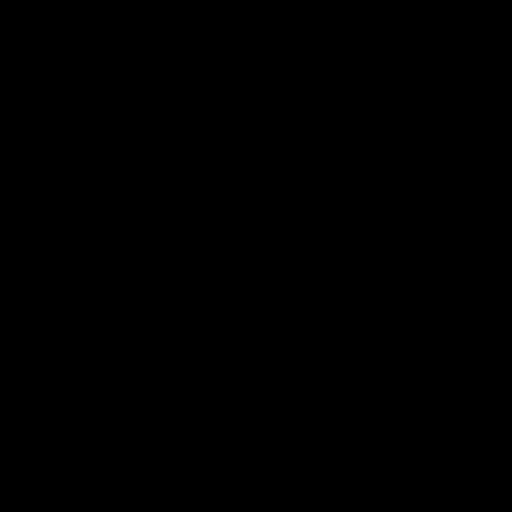
[im 151/301]
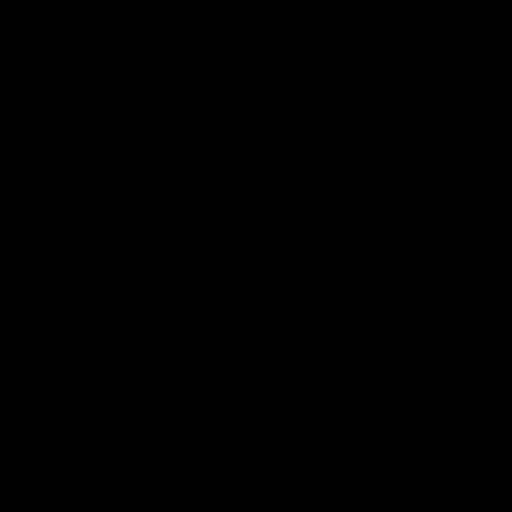
[im 301/301]
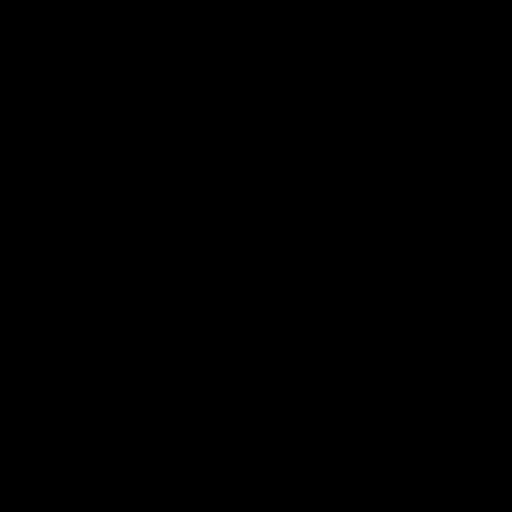

[Series 1500: T1 post-contrast · axial · 0.9mm · 0.50mm/px · z∈[-120,+135]mm · 3 of 301 slices shown (2 of 2)]
[im 1/301]
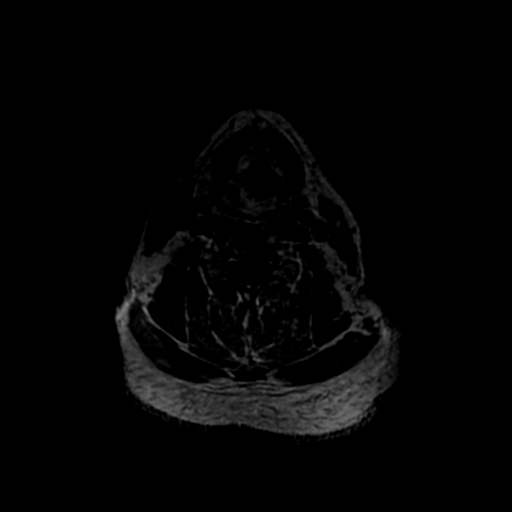
[im 151/301]
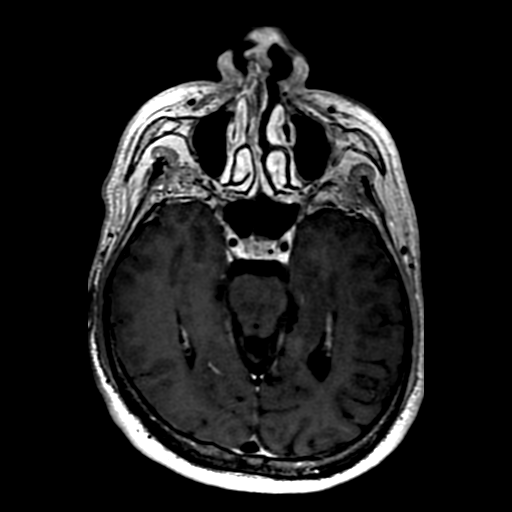
[im 301/301]
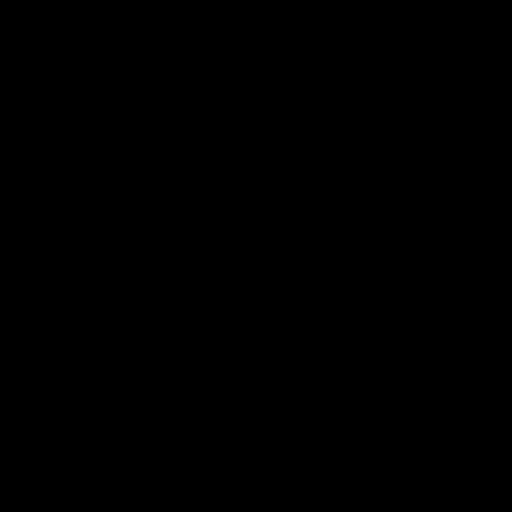

[22 of 48 positions shown; findings below may reference images not displayed]

FINDINGS: Brain: Since the recent prior study, no significant change in
enhancing mass of the right frontal lobe ventral to the lateral
precentral gyrus. Extensive surrounding edema in the frontoparietal
lobes is similarly unchanged. As before, there are some associated
chronic blood products. Regional mass effect is stable. No
hydrocephalus or herniation pattern.

No additional mass or abnormal enhancement.

Chronic microhemorrhage is again identified in the left occipital
lobe. Additional scattered small foci of T2 hyperintensity in the
supratentorial white matter probably reflect stable chronic
microvascular ischemic changes.

Vascular: Major vessel flow voids at the skull base are preserved.

Skull and upper cervical spine: Normal marrow signal is preserved.

Sinuses/Orbits: Paranasal sinuses are aerated. Orbits are
unremarkable.

Other: Sella is unremarkable.  Mastoid air cells are clear.
IMPRESSION: No significant change in presumed right frontal metastasis ventral
to the lateral precentral gyrus. Unchanged associated edema and mild
mass effect.

## 2020-10-17 MED ORDER — LIDOCAINE HCL 1 % IJ SOLN
INTRAMUSCULAR | Status: AC
  Filled 2020-10-17: qty 20

## 2020-10-17 MED ORDER — GADOBUTROL 1 MMOL/ML IV SOLN
8.0000 mL | Freq: Once | INTRAVENOUS | Status: AC | PRN
  Administered 2020-10-17: 8 mL via INTRAVENOUS

## 2020-10-17 NOTE — Progress Notes (Signed)
PROGRESS NOTE    Kenneth Grant  IPJ:[id number] DOB: Sep 17, 1960 DOA: 10/15/2020 PCP: Pcp, No    Brief Narrative:  Kenneth Grant was admitted to the hospital with a newly found right frontal lobe mass with vasogenic edema and mass-effect in the setting of diffuse metastatic disease, unknown primary malignancy.  60 year old incarcerated male with no significant past medical history who was brought to the hospital after an episode of syncope with positive head trauma.  Apparently he was eating lunch, stood up and lost his consciousness.  Reported left hand weakness, decreased hearing and slurred speech and headache for about 24 hours.  On his initial physical examination blood pressure 144/97, heart rate 76, temperature 98.2, respiratory rate 18, oxygen saturation 97%.  His lungs are clear to auscultation bilaterally, heart S1-S2, present, rhythmic, soft abdomen, no lower extremity edema.  Patient had dysarthria and facial asymmetry.  Left facial droop.  Left upper extremity pronator drift, decreased left hand grip strength  Head CT with 1.3 cm lesion in the right frontal lobe, with large amount of surrounding vasogenic edema, most consistent with intracranial metastatic disease.  Mass-effect on the right lateral ventricle but no midline shift.  CT chest/ abdomen/ pelvis with a large mass 11.3 x 10.3 x 9.8 cm centered in the upper half of the left kidney consistent with renal cell carcinoma.  Mass is hypervascular and multiple feeding vessels.  There may be extension of the tumor into the surrounding soft tissues.  No vascular involvement of the renal artery or vein.  16 mm solid mass in the lower pole of the left kidney.  Small solid mass in the lower pole of the right kidney.  Numerous bilateral pulmonary nodules.  Bilateral adrenal nodules.  Small low-attenuation lesion in the right hepatic lobe.  Brain MRI with solitary hypervascular and hypercellular.  2.4 cm density mass in the right middle  frontal gyrus with abundant regional vasogenic edema.  Microhemorrhage within the mass.  Mild regional mass-effect but no midline shift.  Patient underwent renal biopsy for further tissue diagnosis.   Plan for Brain SRS on 12/19/20 and neurosurgery on 10/20/20.   Assessment & Plan:   Principal Problem:   Left renal mass Active Problems:   Mass of right frontal lobe   Syncope   Tobacco use   Metastatic neoplastic disease (HCC)    1. Large left renal mass, very high likelihood for malignancy/ diffuse metastatic disease lungs, adrenal, liver and brain.   Pending IR guided biopsy of left renal mass.  Plan for brain surgical resection with pre-operative SRS.   Patient with persistent left facial droop, but otherwise not focal.  OK to discontinue telemetry.   2. Syncope possible seizures. Considering brain lesion and local edema, high risk for seizures. Tolerating well Keppra for seizure prophylactic. Continue with dexamethasone.   3. Tobacco abuse/ anxiety. Smoking cessation counseling  Continue with as needed lorazepam.   4. Reactive leukocytosis. Likely related to systemic steroids, no indication for antibiotic therapy. Continue follow up on cell count in am.   Status is: Inpatient  Remains inpatient appropriate because:IV treatments appropriate due to intensity of illness or inability to take PO   Dispo: The patient is from: Home              Anticipated d/c is to: Home              Patient currently is not medically stable to d/c.   Difficult to place patient No   DVT prophylaxis: Enoxaparin  Code Status:   full  Family Communication:  No family at the bedside      Consultants:   Oncology  Radiation Oncology   Neurosurgery   IR    Subjective: Patient is feeling well, no nausea or vomiting, no headache, no chest pain or dyspnea.   Objective: Vitals:   10/16/20 1449 10/16/20 2238 10/17/20 0604 10/17/20 1544  BP: 106/69 115/69 115/83 129/84   Pulse: 75 79 72 91  Resp: 16 18 18 16   Temp: 98.2 F (36.8 C) (!) 97.4 F (36.3 C) 97.6 F (36.4 C) 97.7 F (36.5 C)  TempSrc: Oral Oral Oral Oral  SpO2: 95% 97% 96% 100%  Weight:      Height:        Intake/Output Summary (Last 24 hours) at 10/17/2020 1545 Last data filed at 10/16/2020 2142 Gross per 24 hour  Intake 240 ml  Output --  Net 240 ml   Filed Weights   10/15/20 0100  Weight: 83.9 kg    Examination:   General: Not in pain or dyspnea, deconditioned  Neurology: Awake and alert, non focal. Left facial droop.  E ENT: mild pallor, no icterus, oral mucosa moist Cardiovascular: No JVD. S1-S2 present, rhythmic, no gallops, rubs, or murmurs. No lower extremity edema. Pulmonary: positive breath sounds bilaterally, adequate air movement, no wheezing, rhonchi or rales. Gastrointestinal. Abdomen soft and non tender Skin. No rashes Musculoskeletal: no joint deformities     Data Reviewed: I have personally reviewed following labs and imaging studies  CBC: Recent Labs  Lab 10/15/20 0202 10/16/20 0605 10/17/20 0527  WBC 9.7 17.0* 17.1*  NEUTROABS 6.1  --   --   HGB 13.4 13.3 12.8*  HCT 39.3 39.7 38.4*  MCV 83.1 85.2 85.3  PLT 207 232 798   Basic Metabolic Panel: Recent Labs  Lab 10/15/20 0202 10/16/20 0605 10/17/20 0527  NA 137 135 137  K 4.0 5.4* 4.7  CL 103 104 105  CO2 25 23 24   GLUCOSE 107* 140* 144*  BUN 16 28* 32*  CREATININE 0.90 1.01 0.88  CALCIUM 9.4 9.3 9.2   GFR: Estimated Creatinine Clearance: 96.8 mL/min (by C-G formula based on SCr of 0.88 mg/dL). Liver Function Tests: No results for input(s): AST, ALT, ALKPHOS, BILITOT, PROT, ALBUMIN in the last 168 hours. No results for input(s): LIPASE, AMYLASE in the last 168 hours. No results for input(s): AMMONIA in the last 168 hours. Coagulation Profile: Recent Labs  Lab 10/17/20 0527  INR 1.0   Cardiac Enzymes: No results for input(s): CKTOTAL, CKMB, CKMBINDEX, TROPONINI in the last 168  hours. BNP (last 3 results) No results for input(s): PROBNP in the last 8760 hours. HbA1C: No results for input(s): HGBA1C in the last 72 hours. CBG: No results for input(s): GLUCAP in the last 168 hours. Lipid Profile: No results for input(s): CHOL, HDL, LDLCALC, TRIG, CHOLHDL, LDLDIRECT in the last 72 hours. Thyroid Function Tests: No results for input(s): TSH, T4TOTAL, FREET4, T3FREE, THYROIDAB in the last 72 hours. Anemia Panel: No results for input(s): VITAMINB12, FOLATE, FERRITIN, TIBC, IRON, RETICCTPCT in the last 72 hours.    Radiology Studies: I have reviewed all of the imaging during this hospital visit personally     Scheduled Meds: . dexamethasone  4 mg Oral Q8H  . levETIRAcetam  500 mg Oral BID  . sodium chloride flush  3 mL Intravenous Q12H   Continuous Infusions:   LOS: 2 days        Coury Grieger Gerome Apley,  MD   

## 2020-10-17 NOTE — Progress Notes (Signed)
I spoke with the patient to let him know that his MRI did not show any further disease, only the solitary disease in the brain we already were aware of. We will continue with plans for [facility] on Thursday, Surgery on Friday. The DOJ office is trying to petition the court to allow him to be released so he can proceed with treatment as well. This is still pending.     Carola Rhine, PAC

## 2020-10-17 NOTE — Telephone Encounter (Signed)
Spoke with [facility] about getting the 3T SRS Protocol brain scan completed this morning. They said to have him there at 12:00. I then spoke with the patient's nurse, Brayton Layman who will help to coordinate the transportation over via [place].   Mont Dutton R.T.(R)(T) Radiation Special Procedures Navigator

## 2020-10-18 ENCOUNTER — Inpatient Hospital Stay (HOSPITAL_COMMUNITY)

## 2020-10-18 ENCOUNTER — Encounter (HOSPITAL_COMMUNITY): Payer: Self-pay | Admitting: Internal Medicine

## 2020-10-18 ENCOUNTER — Other Ambulatory Visit: Payer: Self-pay | Admitting: Radiation Therapy

## 2020-10-18 DIAGNOSIS — N2889 Other specified disorders of kidney and ureter: Secondary | ICD-10-CM | POA: Diagnosis not present

## 2020-10-18 LAB — CBC
HCT: 40.4 % (ref 39.0–52.0)
Hemoglobin: 13.3 g/dL (ref 13.0–17.0)
MCH: 28.1 pg (ref 26.0–34.0)
MCHC: 32.9 g/dL (ref 30.0–36.0)
MCV: 85.4 fL (ref 80.0–100.0)
Platelets: 214 10*3/uL (ref 150–400)
RBC: 4.73 MIL/uL (ref 4.22–5.81)
RDW: 14.5 % (ref 11.5–15.5)
WBC: 14.3 10*3/uL — ABNORMAL HIGH (ref 4.0–10.5)
nRBC: 0 % (ref 0.0–0.2)

## 2020-10-18 IMAGING — MR MR ABDOMEN WO/W CM
19 series · 48 of 48 positions shown · IV contrast (8.5 mL GADAVIST)
Comparison: CT chest abdomen pelvis dated [DATE]

CLINICAL DATA: Metastatic disease evaluation, suspected left renal
cell carcinoma

EXAM:
MRI ABDOMEN WITHOUT AND WITH CONTRAST
TECHNIQUE: Multiplanar multisequence MR imaging of the abdomen was performed
both before and after the administration of intravenous contrast.
CONTRAST:  9mL GADAVIST GADOBUTROL 1 MMOL/ML IV SOLN

[Series 2: DWI · axial · 6.0mm · 1.49mm/px · z∈[-220,+97]mm · 3 of 90 slices shown (1 of 2)]
[im 1/90]
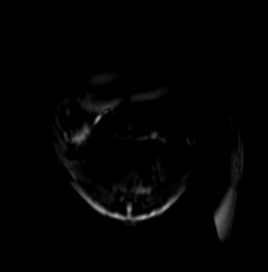
[im 45/90]
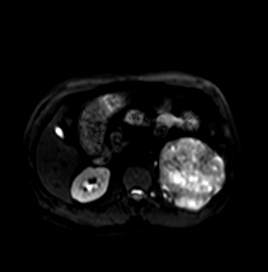
[im 90/90]
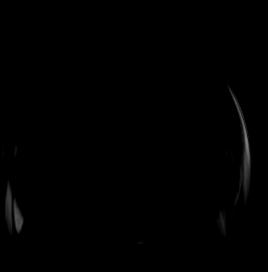

[Series 3: DWI · axial · 6.0mm · 1.49mm/px · z∈[-220,+97]mm · 2 of 45 slices shown (2 of 2)]
[im 1/45]
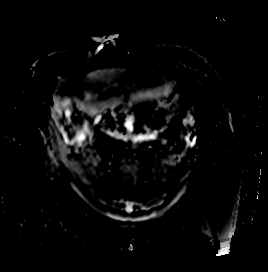
[im 45/45]
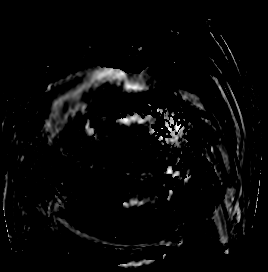

[Series 4: T2 fat-sat · axial · 6.0mm · 1.25mm/px · z∈[-216,+101]mm · 2 of 45 slices shown]
[im 1/45]
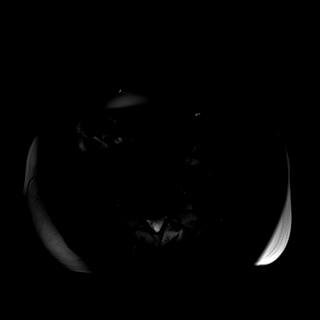
[im 45/45]
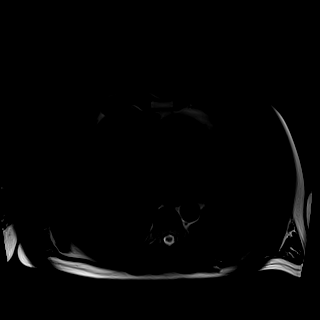

[Series 6: T2 · coronal · 6.0mm · 1.56mm/px · 1 of 30 slices shown (1 of 2)]
[im 1/30]
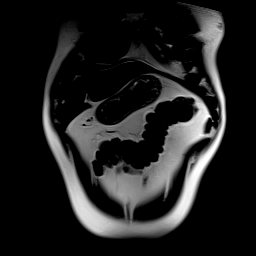

[Series 7: T1 · axial · 3.0mm · 1.38mm/px · z∈[-196,+65]mm · 3 of 88 slices shown (1 of 2)]
[im 1/88]
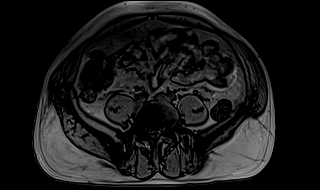
[im 44/88]
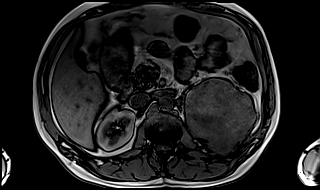
[im 88/88]
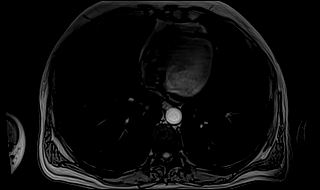

[Series 8: T1 · axial · 3.0mm · 1.38mm/px · z∈[-196,+65]mm · 3 of 88 slices shown (2 of 2)]
[im 1/88]
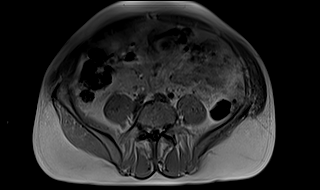
[im 44/88]
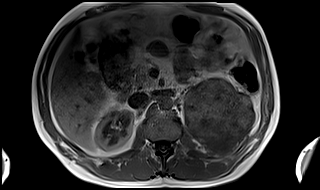
[im 88/88]
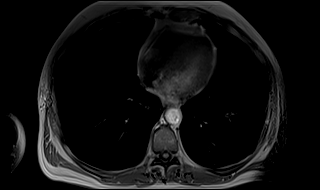

[Series 9: bSSFP · axial · 4.0mm · 0.84mm/px · z∈[-81,+75]mm · 2 of 40 slices shown (1 of 2)]
[im 1/40]
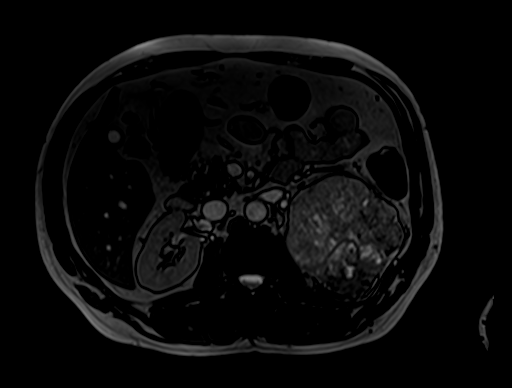
[im 40/40]
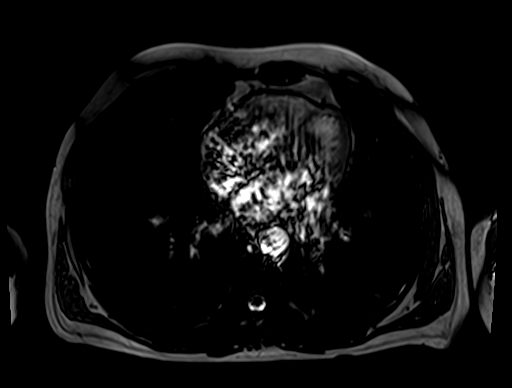

[Series 10: bSSFP · axial · 4.0mm · 0.84mm/px · 1 of 36 slices shown (2 of 2)]
[im 1/36]
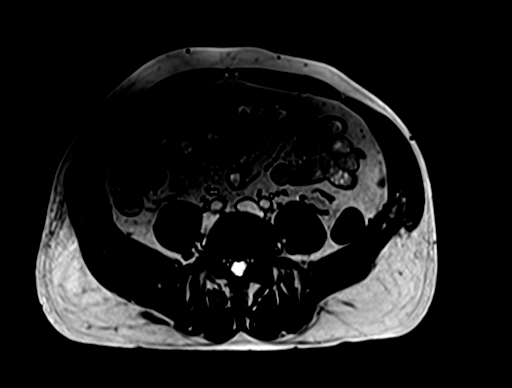

[Series 12: T1 dynamic · axial · 3.5mm · 1.25mm/px · z∈[-199,+78]mm · 3 of 80 slices shown (1 of 10)]
[im 1/80]
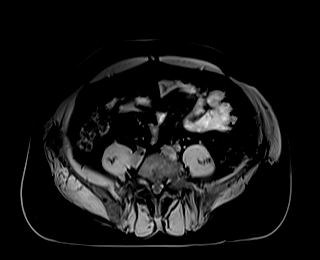
[im 40/80]
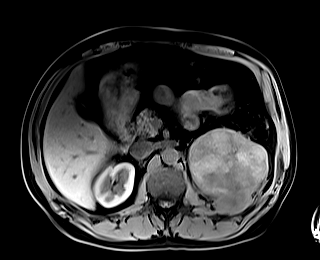
[im 80/80]
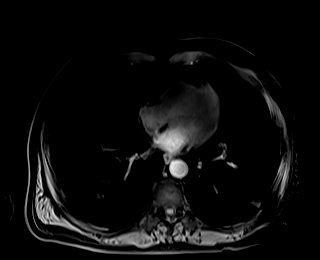

[Series 16: T1 dynamic · axial · 3.5mm · 1.25mm/px · z∈[-199,+78]mm · 3 of 80 slices shown (2 of 10)]
[im 1/80]
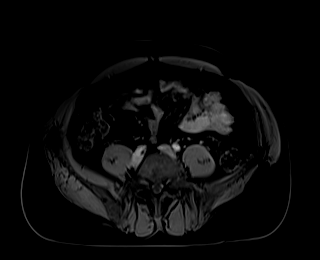
[im 40/80]
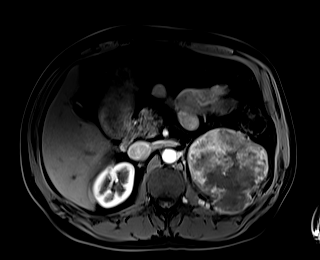
[im 80/80]
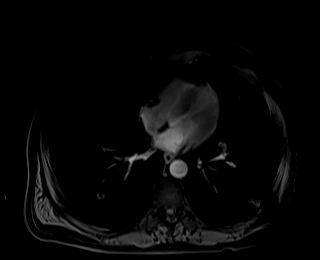

[Series 17: T1 dynamic · axial · 3.5mm · 1.25mm/px · z∈[-199,+78]mm · 3 of 80 slices shown (3 of 10)]
[im 1/80]
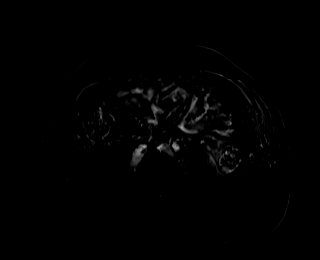
[im 40/80]
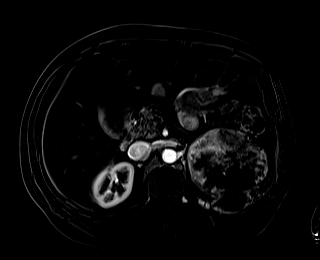
[im 80/80]
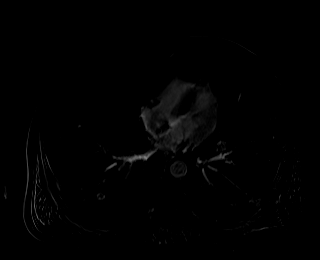

[Series 20: T1 dynamic · axial · 3.5mm · 1.25mm/px · z∈[-199,+78]mm · 3 of 80 slices shown (4 of 10)]
[im 1/80]
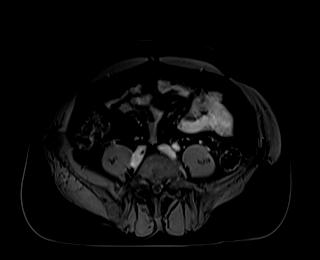
[im 40/80]
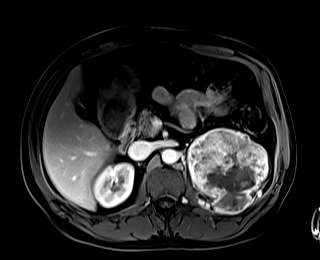
[im 80/80]
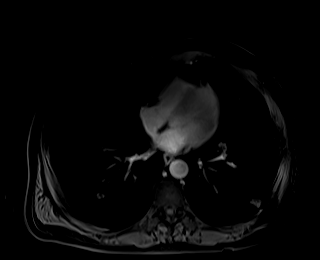

[Series 21: T1 dynamic · axial · 3.5mm · 1.25mm/px · z∈[-199,+78]mm · 3 of 80 slices shown (5 of 10)]
[im 1/80]
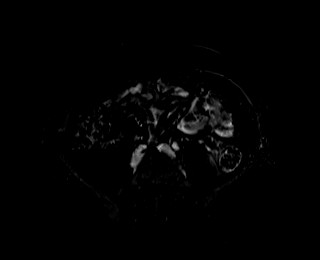
[im 40/80]
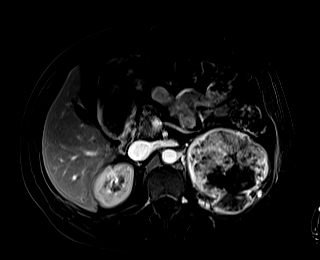
[im 80/80]
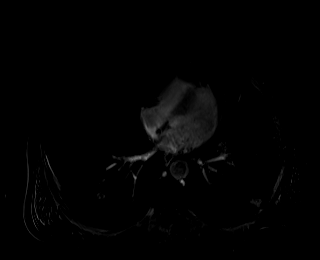

[Series 24: T1 dynamic · axial · 3.5mm · 1.25mm/px · z∈[-199,+78]mm · 3 of 80 slices shown (6 of 10)]
[im 1/80]
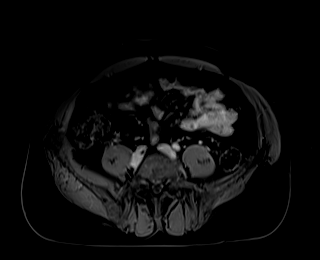
[im 40/80]
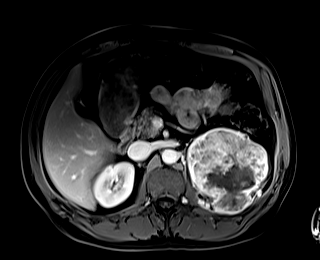
[im 80/80]
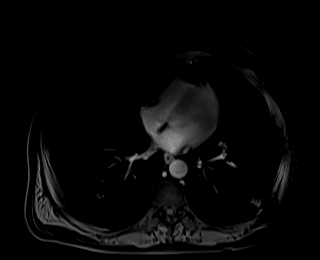

[Series 25: T1 dynamic · axial · 3.5mm · 1.25mm/px · z∈[-199,+78]mm · 3 of 80 slices shown (7 of 10)]
[im 1/80]
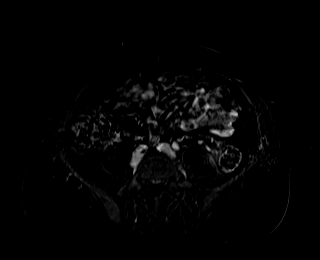
[im 40/80]
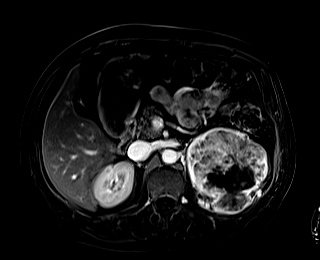
[im 80/80]
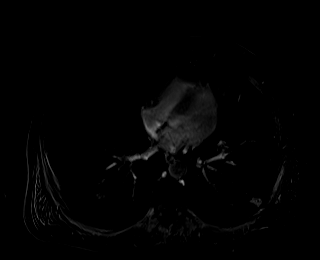

[Series 27: T1 dynamic · coronal · 4.0mm · 1.41mm/px · 2 of 52 slices shown (8 of 10)]
[im 1/52]
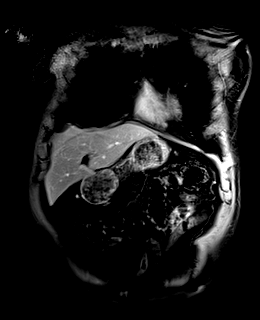
[im 52/52]
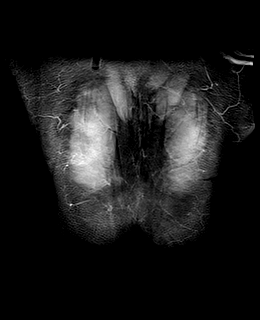

[Series 28: T2 · axial · 6.0mm · 1.56mm/px · z∈[-201,+80]mm · 2 of 40 slices shown (2 of 2)]
[im 1/40]
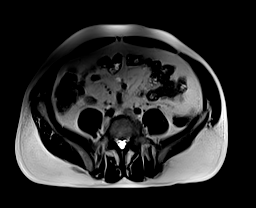
[im 40/40]
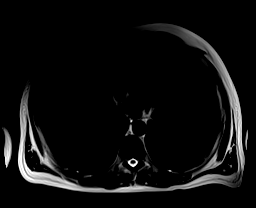

[Series 31: T1 dynamic · axial · 3.5mm · 1.25mm/px · z∈[-199,+78]mm · 3 of 80 slices shown (9 of 10)]
[im 1/80]
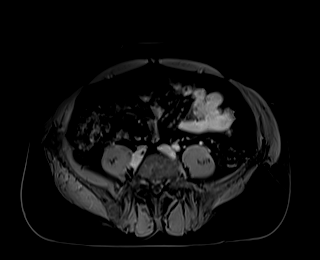
[im 40/80]
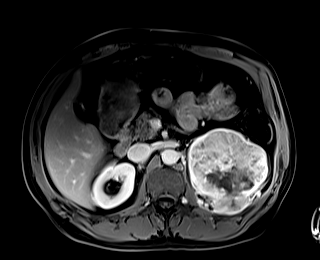
[im 80/80]
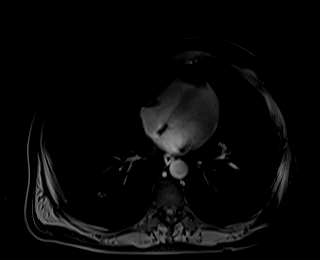

[Series 32: T1 dynamic · axial · 3.5mm · 1.25mm/px · z∈[-199,+78]mm · 3 of 80 slices shown (10 of 10)]
[im 1/80]
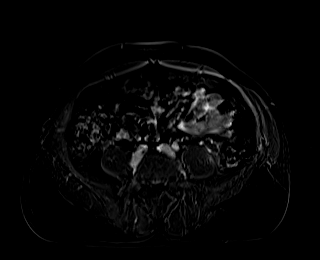
[im 40/80]
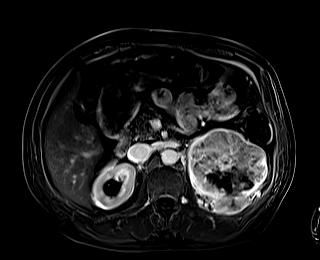
[im 80/80]
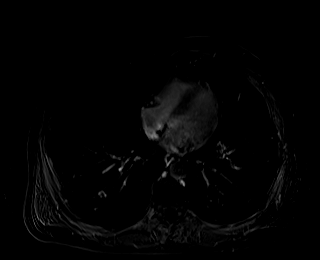

[48 of 48 positions shown; findings below may reference images not displayed]

FINDINGS: Lower chest: Numerous bilateral pulmonary nodules, measuring up to
12 mm in the left lower lobe (series 16/image 1, better evaluated on
recent CT.

Hepatobiliary: Tiny probable cyst inferiorly in the right hepatic
lobe (series 4/image 23). No suspicious/enhancing hepatic lesions.

Gallbladder is unremarkable. No intrahepatic or extrahepatic ductal
dilatation.

Pancreas:  Within normal limits.

Spleen:  Within normal limits.

Adrenals/Urinary Tract: Bilateral adrenal nodules, measuring 17 mm
on the left and 16 mm on the right, suspicious for metastases.

10.6 x 9.7 x 11.7 cm left upper pole renal mass (series 16/image
39), compatible renal cell carcinoma. Mass extends posteriorly
beyond Gerota's fascia with additional enhancing nodularity along
the posterior pararenal space (series 16/image 36). No definite
renal vein invasion.

Three additional enhancing lesions are suspected in the left lower
pole, measuring up to 2.3 cm (series 31/images 62, 66, and 68),
worrisome for additional sites of tumor in the left kidney. A 2.0 cm
enhancing lesion is also present in the lateral right lower kidney
(series 31/image 51), suggesting contralateral renal cell carcinoma.

No hydronephrosis.

Stomach/Bowel: Stomach is within normal limits.

Visualized bowel is unremarkable.

Vascular/Lymphatic:  No evidence of abdominal aortic aneurysm.

Single bilateral renal artery and vein.

No suspicious abdominal lymphadenopathy.

Other:  No abdominal ascites.

Musculoskeletal: No focal osseous lesions.
IMPRESSION: 11.7 cm left upper pole renal mass, corresponding to the patient's
known renal cell carcinoma, extending beyond Gerota's fascia with
enhancing nodularity along the posterior pararenal space.

Additional enhancing lesions in the bilateral kidneys, suggesting
multifocal renal cell carcinoma versus oncocytic neoplasms, as
described above. This includes a 2.0 cm lesion in the contralateral
lower pole.

Bilateral adrenal metastases measuring up to 1.7 cm.

Numerous pulmonary metastases, incompletely visualized.

## 2020-10-18 MED ORDER — LIDOCAINE HCL 1 % IJ SOLN
INTRAMUSCULAR | Status: AC | PRN
  Administered 2020-10-18: 10 mL via INTRADERMAL

## 2020-10-18 MED ORDER — GADOBUTROL 1 MMOL/ML IV SOLN
9.0000 mL | Freq: Once | INTRAVENOUS | Status: AC | PRN
  Administered 2020-10-18: 9 mL via INTRAVENOUS

## 2020-10-18 MED ORDER — FENTANYL CITRATE (PF) 100 MCG/2ML IJ SOLN
INTRAMUSCULAR | Status: AC
  Filled 2020-10-18: qty 2

## 2020-10-18 MED ORDER — MIDAZOLAM HCL 2 MG/2ML IJ SOLN
INTRAMUSCULAR | Status: AC
  Filled 2020-10-18: qty 2

## 2020-10-18 MED ORDER — GELATIN ABSORBABLE 12-7 MM EX MISC
CUTANEOUS | Status: AC
  Filled 2020-10-18: qty 1

## 2020-10-18 MED ORDER — GELATIN ABSORBABLE 12-7 MM EX MISC
CUTANEOUS | Status: AC | PRN
  Administered 2020-10-18: 1 via TOPICAL

## 2020-10-18 MED ORDER — MIDAZOLAM HCL 2 MG/2ML IJ SOLN
INTRAMUSCULAR | Status: AC | PRN
  Administered 2020-10-18 (×2): 1 mg via INTRAVENOUS

## 2020-10-18 MED ORDER — FENTANYL CITRATE (PF) 100 MCG/2ML IJ SOLN
INTRAMUSCULAR | Status: AC | PRN
  Administered 2020-10-18 (×2): 50 ug via INTRAVENOUS

## 2020-10-18 MED ORDER — LIDOCAINE HCL 1 % IJ SOLN
INTRAMUSCULAR | Status: AC
  Filled 2020-10-18: qty 20

## 2020-10-18 NOTE — Procedures (Signed)
Interventional Radiology Procedure Note  Procedure: [place] left renal mass core bx    Complications: None  Estimated Blood Loss:  min  Findings: 18 g cores x 3    M. Daryll Brod, MD

## 2020-10-18 NOTE — Progress Notes (Signed)
PROGRESS NOTE    Kenneth Grant  GOT:[id number] DOB: 12-31-1960 DOA: 10/15/2020 PCP: Pcp, No    Brief Narrative:  60 year old incarcerated male with no significant past medical history who was brought to the hospital after an episode of syncope  -Reported left hand weakness, decreased hearing and slurred speech and headache for about 24 hours.  -CT brain report noted right frontal lobe lesion with vasogenic edema, CT abdomen pelvis noted large left kidney mass consistent with renal cell carcinoma  -Brain MRI with solitary hypervascular and hypercellular.  2.4 cm density mass in the right middle frontal gyrus with abundant regional vasogenic edema -Neurosurgery and radiation oncology consulted -Underwent renal biopsy 5/25  Plan for Brain SRS on 12/19/20 and neurosurgery on 10/20/20.   Assessment & Plan:  Widely metastatic cancer  Suspected RCC as primary  Large solitary brain metastasis  -MRI brain notes solitary enhancing 2.4 cm mass with significant edema in the right middle frontal gyrus -CT abdomen pelvis notes large left renal mass most consistent with RCC -Underwent left renal biopsy today -Neurosurgery and radiation oncology following -Continue Keppra and dexamethasone -Plan for SRS followed by surgical resection on 5/27 -Will plan transfer to [facility] on 5/26  Syncope, suspected seizure  -Now stable, tolerating Keppra and dexamethasone  Tobacco abuse/ anxiety.  -Continue with as needed lorazepam.    Reactive leukocytosis. Likely related to systemic steroids,    DVT prophylaxis: Enoxaparin held for biopsy Code Status:   full  Family Communication:  No family at the bedside     Remains inpatient appropriate because:IV treatments appropriate due to intensity of illness or inability to take PO   Dispo: The patient is from: Home              Anticipated d/c is to: Home              Patient currently is not medically stable to d/c.   Difficult to place patient  No  Consultants:   Oncology  Radiation Oncology   Neurosurgery   IR    Subjective: N.p.o. for biopsy today, denies any specific complaints  Objective: Vitals:   10/18/20 1320 10/18/20 1330 10/18/20 1335 10/18/20 1424  BP: 125/64 123/66 124/65 135/78  Pulse: 67 65 67 63  Resp: 13 14 16 15   Temp:      TempSrc:      SpO2: 100% 98% 99% 95%  Weight:      Height:        Intake/Output Summary (Last 24 hours) at 10/18/2020 1601 Last data filed at 10/18/2020 1444 Gross per 24 hour  Intake 10 ml  Output 2 ml  Net 8 ml   Filed Weights   10/15/20 0100  Weight: 83.9 kg    Examination:   General: Middle-aged male, laying in bed, awake alert oriented x3, no distress, HEENT: Mild left facial droop noted CVS: S1-S2, regular rate rhythm Lungs: Clear bilaterally Abdomen: Soft, nontender, bowel sounds present Neuro: Mild left facial droop and wrist drop of left hand Skin. No rashes on exposed skin  Data Reviewed: I have personally reviewed following labs and imaging studies  CBC: Recent Labs  Lab 10/15/20 0202 10/16/20 0605 10/17/20 0527 10/18/20 0542  WBC 9.7 17.0* 17.1* 14.3*  NEUTROABS 6.1  --   --   --   HGB 13.4 13.3 12.8* 13.3  HCT 39.3 39.7 38.4* 40.4  MCV 83.1 85.2 85.3 85.4  PLT 207 232 221 597   Basic Metabolic Panel: Recent Labs  Lab 10/15/20  0202 10/16/20 0605 10/17/20 0527  NA 137 135 137  K 4.0 5.4* 4.7  CL 103 104 105  CO2 25 23 24   GLUCOSE 107* 140* 144*  BUN 16 28* 32*  CREATININE 0.90 1.01 0.88  CALCIUM 9.4 9.3 9.2   GFR: Estimated Creatinine Clearance: 96.8 mL/min (by C-G formula based on SCr of 0.88 mg/dL). Liver Function Tests: No results for input(s): AST, ALT, ALKPHOS, BILITOT, PROT, ALBUMIN in the last 168 hours. No results for input(s): LIPASE, AMYLASE in the last 168 hours. No results for input(s): AMMONIA in the last 168 hours. Coagulation Profile: Recent Labs  Lab 10/17/20 0527  INR 1.0   Cardiac Enzymes: No results  for input(s): CKTOTAL, CKMB, CKMBINDEX, TROPONINI in the last 168 hours. BNP (last 3 results) No results for input(s): PROBNP in the last 8760 hours. HbA1C: No results for input(s): HGBA1C in the last 72 hours. CBG: No results for input(s): GLUCAP in the last 168 hours. Lipid Profile: No results for input(s): CHOL, HDL, LDLCALC, TRIG, CHOLHDL, LDLDIRECT in the last 72 hours. Thyroid Function Tests: No results for input(s): TSH, T4TOTAL, FREET4, T3FREE, THYROIDAB in the last 72 hours. Anemia Panel: No results for input(s): VITAMINB12, FOLATE, FERRITIN, TIBC, IRON, RETICCTPCT in the last 72 hours.  Scheduled Meds: . dexamethasone  4 mg Oral Q8H  . gelatin adsorbable      . levETIRAcetam  500 mg Oral BID  . sodium chloride flush  3 mL Intravenous Q12H   Continuous Infusions:   LOS: 3 days   Domenic Polite, MD

## 2020-10-18 NOTE — Plan of Care (Signed)
  Problem: Clinical Measurements: Goal: Diagnostic test results will improve Outcome: Progressing Goal: Cardiovascular complication will be avoided Outcome: Progressing   Problem: Coping: Goal: Level of anxiety will decrease Outcome: Progressing   Problem: Pain Managment: Goal: General experience of comfort will improve Outcome: Progressing   Problem: Safety: Goal: Ability to remain free from injury will improve Outcome: Progressing   Problem: Health Behavior/Discharge Planning: Goal: Ability to manage health-related needs will improve Outcome: Progressing   Problem: Clinical Measurements: Goal: Ability to maintain clinical measurements within normal limits will improve Outcome: Progressing   Problem: Coping: Goal: Level of anxiety will decrease Outcome: Progressing

## 2020-10-19 ENCOUNTER — Ambulatory Visit
Admit: 2020-10-19 | Discharge: 2020-10-19 | Disposition: A | Discharge status: Home / Self Care | Attending: Radiation Oncology | Admitting: Radiation Oncology

## 2020-10-19 ENCOUNTER — Encounter: Payer: Self-pay | Admitting: Radiation Oncology

## 2020-10-19 DIAGNOSIS — Z510 Encounter for antineoplastic radiation therapy: Secondary | ICD-10-CM | POA: Diagnosis not present

## 2020-10-19 DIAGNOSIS — G936 Cerebral edema: Secondary | ICD-10-CM | POA: Diagnosis present

## 2020-10-19 DIAGNOSIS — F17200 Nicotine dependence, unspecified, uncomplicated: Secondary | ICD-10-CM | POA: Diagnosis present

## 2020-10-19 DIAGNOSIS — R29810 Facial weakness: Secondary | ICD-10-CM | POA: Diagnosis present

## 2020-10-19 DIAGNOSIS — Z801 Family history of malignant neoplasm of trachea, bronchus and lung: Secondary | ICD-10-CM | POA: Diagnosis not present

## 2020-10-19 DIAGNOSIS — C787 Secondary malignant neoplasm of liver and intrahepatic bile duct: Secondary | ICD-10-CM | POA: Diagnosis present

## 2020-10-19 DIAGNOSIS — R471 Dysarthria and anarthria: Secondary | ICD-10-CM | POA: Diagnosis present

## 2020-10-19 DIAGNOSIS — F419 Anxiety disorder, unspecified: Secondary | ICD-10-CM | POA: Diagnosis present

## 2020-10-19 DIAGNOSIS — D496 Neoplasm of unspecified behavior of brain: Secondary | ICD-10-CM | POA: Diagnosis not present

## 2020-10-19 DIAGNOSIS — R55 Syncope and collapse: Secondary | ICD-10-CM | POA: Diagnosis not present

## 2020-10-19 DIAGNOSIS — N2889 Other specified disorders of kidney and ureter: Secondary | ICD-10-CM | POA: Diagnosis not present

## 2020-10-19 DIAGNOSIS — Z20822 Contact with and (suspected) exposure to covid-19: Secondary | ICD-10-CM | POA: Diagnosis present

## 2020-10-19 DIAGNOSIS — C642 Malignant neoplasm of left kidney, except renal pelvis: Secondary | ICD-10-CM | POA: Diagnosis present

## 2020-10-19 DIAGNOSIS — C7801 Secondary malignant neoplasm of right lung: Secondary | ICD-10-CM | POA: Diagnosis present

## 2020-10-19 DIAGNOSIS — C7802 Secondary malignant neoplasm of left lung: Secondary | ICD-10-CM | POA: Diagnosis present

## 2020-10-19 DIAGNOSIS — D72828 Other elevated white blood cell count: Secondary | ICD-10-CM | POA: Diagnosis present

## 2020-10-19 DIAGNOSIS — G9389 Other specified disorders of brain: Secondary | ICD-10-CM | POA: Diagnosis present

## 2020-10-19 DIAGNOSIS — C7931 Secondary malignant neoplasm of brain: Secondary | ICD-10-CM | POA: Diagnosis present

## 2020-10-19 DIAGNOSIS — C799 Secondary malignant neoplasm of unspecified site: Secondary | ICD-10-CM | POA: Diagnosis not present

## 2020-10-19 DIAGNOSIS — T380X5A Adverse effect of glucocorticoids and synthetic analogues, initial encounter: Secondary | ICD-10-CM | POA: Diagnosis present

## 2020-10-19 LAB — CBC
HCT: 40.9 % (ref 39.0–52.0)
Hemoglobin: 13.5 g/dL (ref 13.0–17.0)
MCH: 27.8 pg (ref 26.0–34.0)
MCHC: 33 g/dL (ref 30.0–36.0)
MCV: 84.2 fL (ref 80.0–100.0)
Platelets: 215 10*3/uL (ref 150–400)
RBC: 4.86 MIL/uL (ref 4.22–5.81)
RDW: 14.3 % (ref 11.5–15.5)
WBC: 10.9 10*3/uL — ABNORMAL HIGH (ref 4.0–10.5)
nRBC: 0 % (ref 0.0–0.2)

## 2020-10-19 LAB — TYPE AND SCREEN
ABO/RH(D): A POS
Antibody Screen: NEGATIVE

## 2020-10-19 LAB — ABO/RH: ABO/RH(D): A POS

## 2020-10-19 LAB — SURGICAL PCR SCREEN
MRSA, PCR: NEGATIVE
Staphylococcus aureus: NEGATIVE

## 2020-10-19 IMAGING — US US BIOPSY
1 series · 15 of 20 positions shown · non-contrast
Comparison: none

INDICATION: large left renal mass consistent with renal cell carcinoma, evidence
of metastatic disease by imaging

[Series 1: us biopsy (kidney) [facility] & [facility] · 15 of 20 slices shown]
[im 1/20]
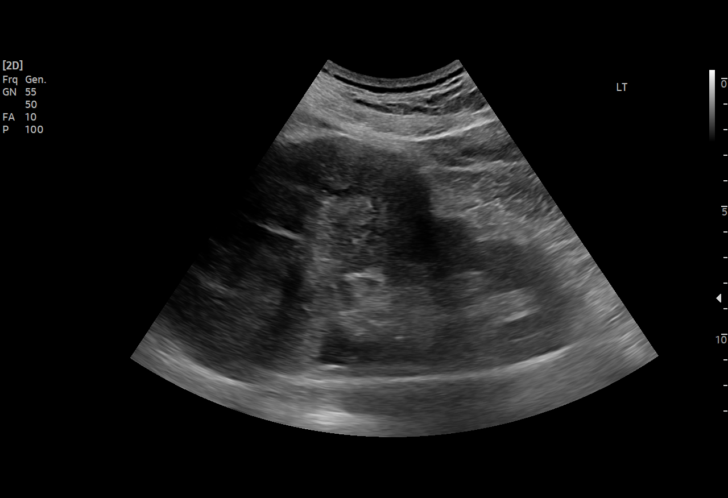
[im 3/20]
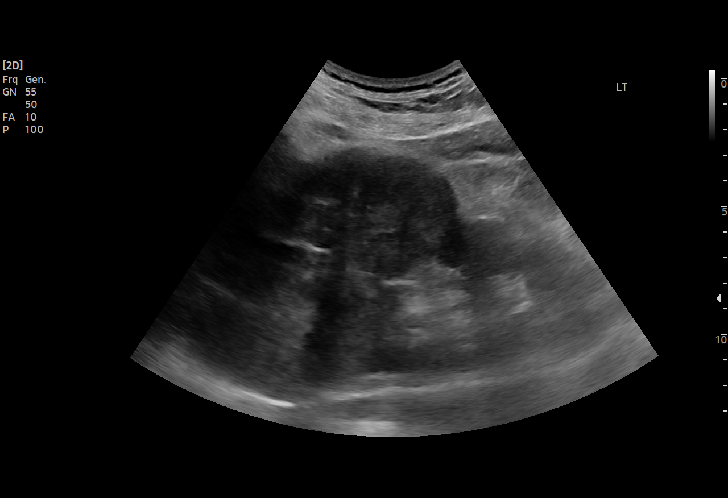
[im 4/20]
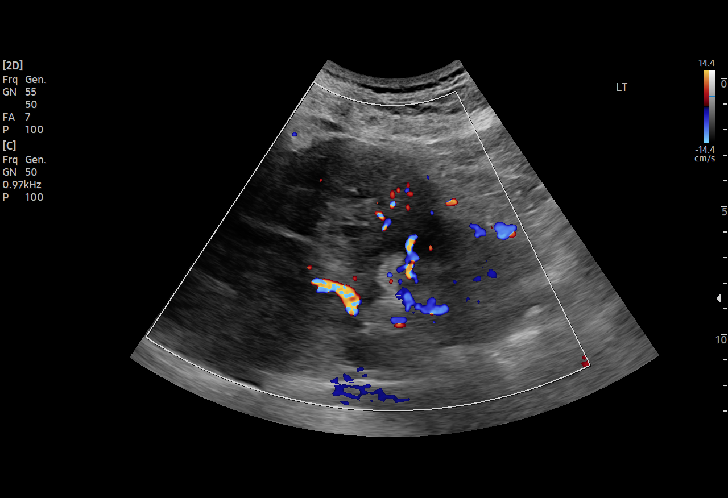
[im 5/20]
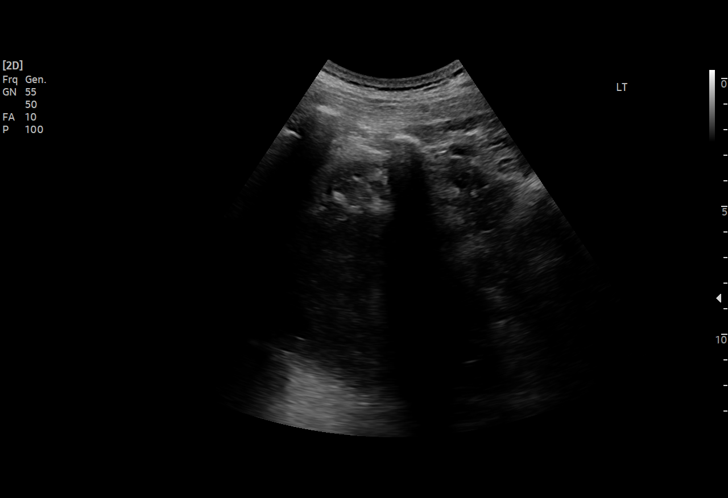
[im 7/20]
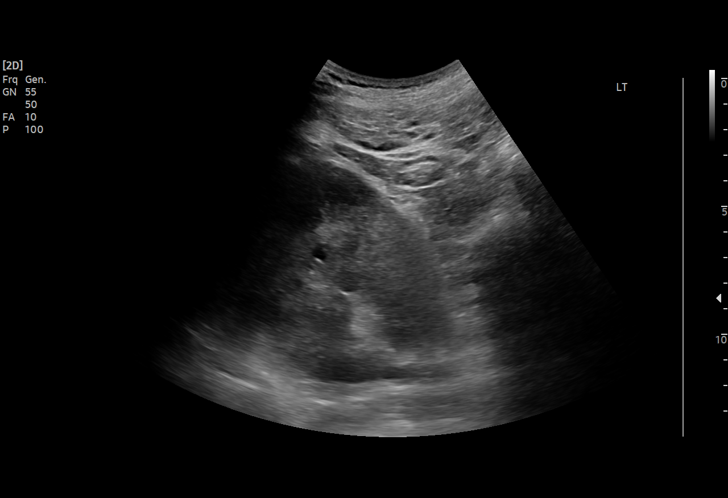
[im 8/20]
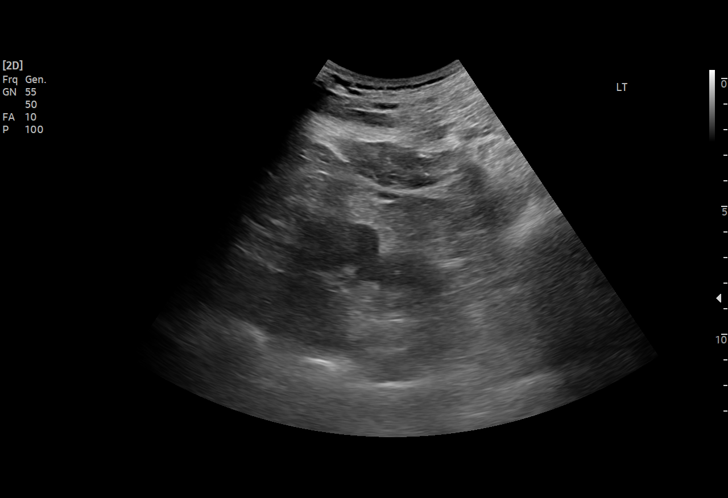
[im 9/20]
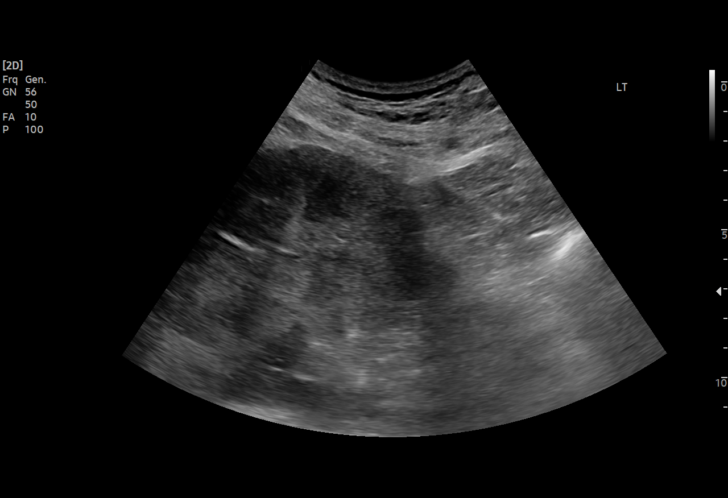
[im 11/20]
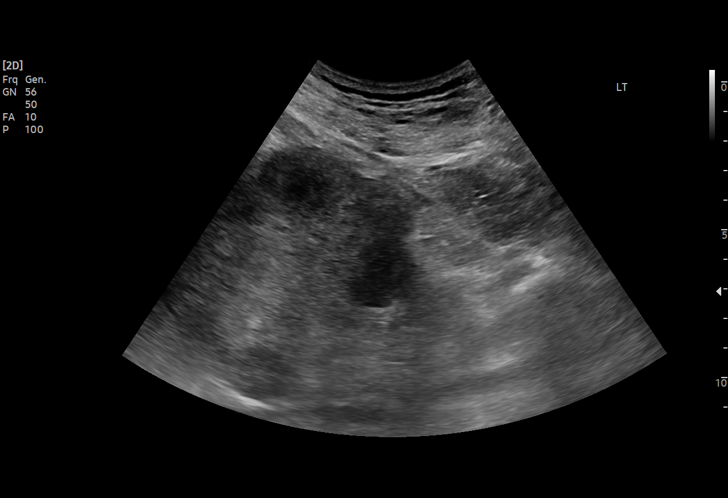
[im 12/20]
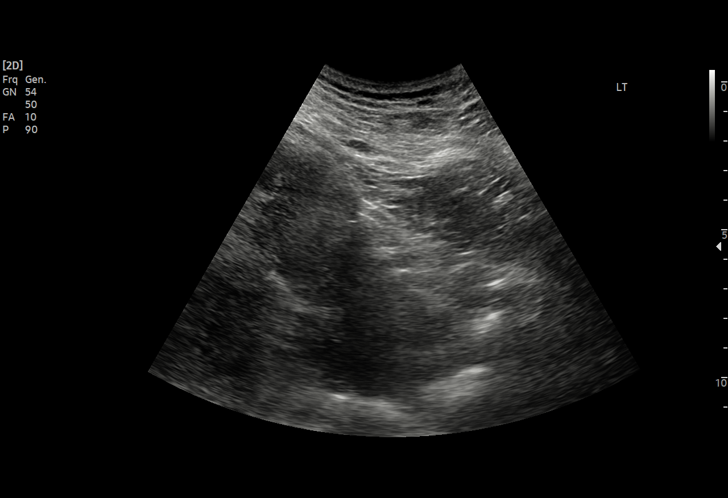
[im 13/20]
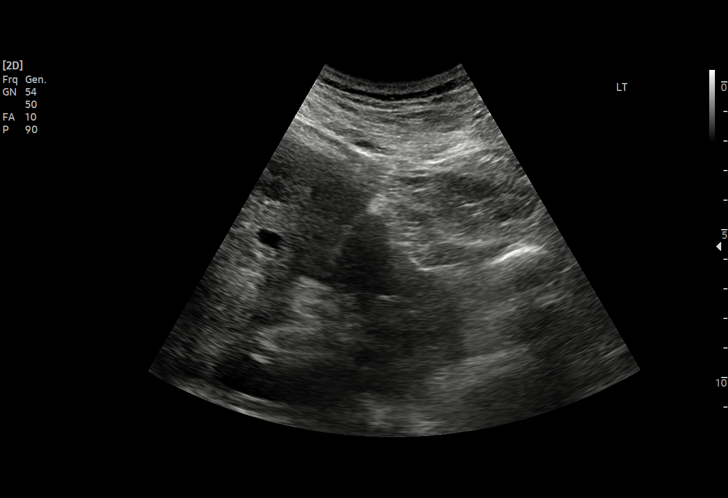
[im 15/20]
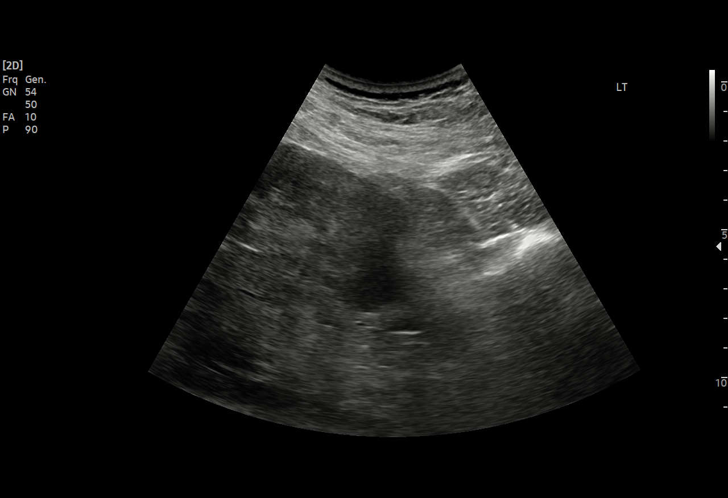
[im 16/20]
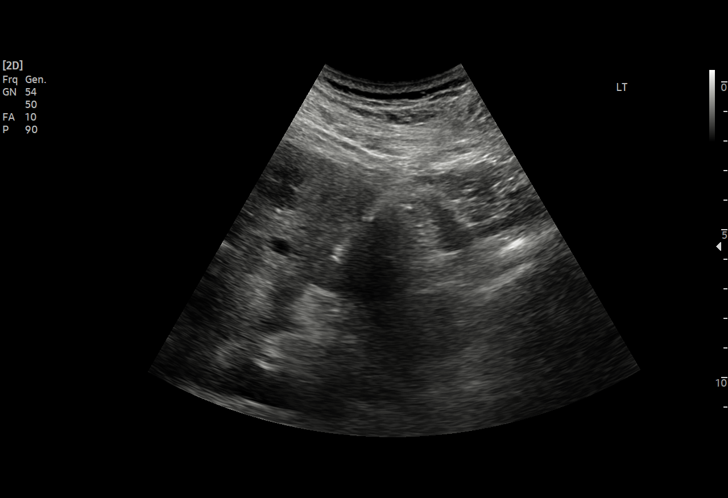
[im 17/20]
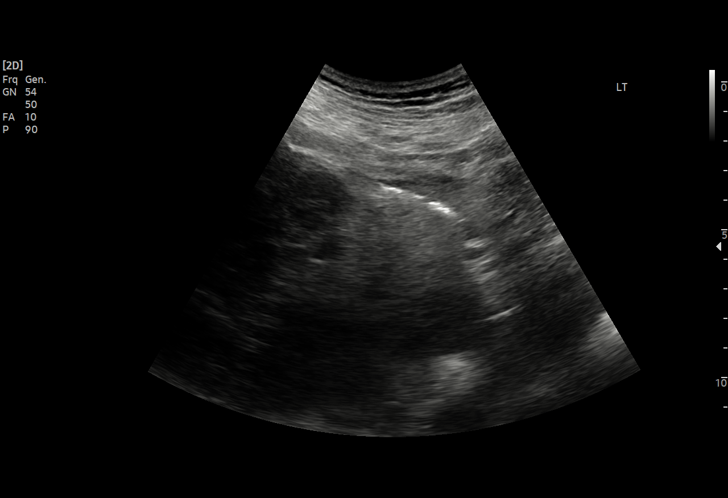
[im 19/20]
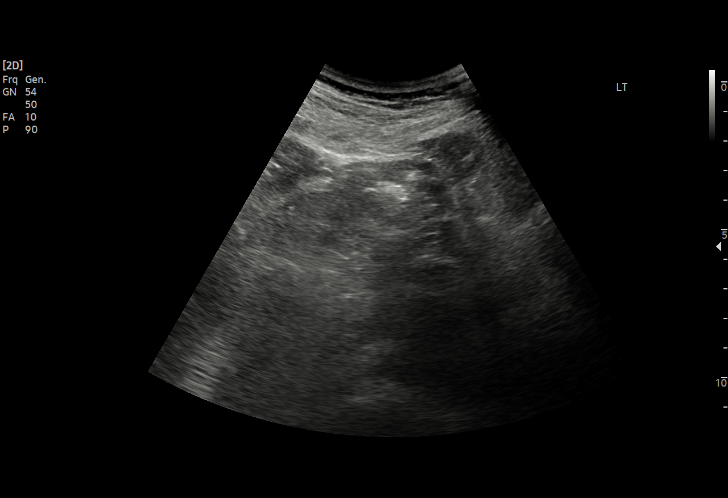
[im 20/20]
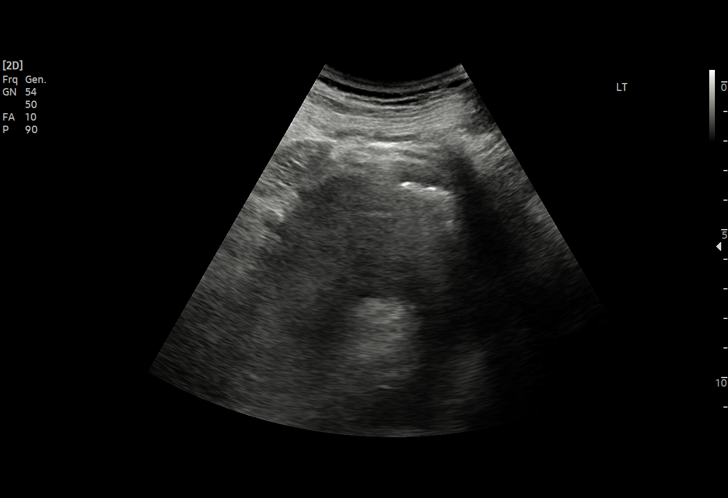

[15 of 20 positions shown; findings below may reference images not displayed]

EXAM:
ULTRASOUND GUIDED CORE BIOPSY OF LEFT RENAL MASS

MEDICATIONS:
1% LIDOCAINE LOCAL

ANESTHESIA/SEDATION:
Versed 2.0mg IV; Fentanyl [JC] IV;

Moderate Sedation Time:  10 minutes

The patient was continuously monitored during the procedure by the
interventional radiology nurse under my direct supervision.

FLUOROSCOPY TIME:  Fluoroscopy Time: None.

COMPLICATIONS:
None immediate.

PROCEDURE:
The procedure, risks, benefits, and alternatives were explained to
the patient. Questions regarding the procedure were encouraged and
answered. The patient understands and consents to the procedure.

Previous imaging reviewed. Patient positioned prone. Preliminary
ultrasound performed. The large left kidney exophytic mass was
localized and marked. This was correlated with the recent CT.

Under sterile conditions and local anesthesia, a 17 gauge coaxial
guide needle was advanced from a posterior approach to the lesion.
Needle position confirmed with ultrasound. Images obtained for
documentation. 18 gauge core biopsies obtained. Samples were intact
and placed in formalin. Needle tract occluded with Gel-Foam.
Postprocedure imaging demonstrates no hemorrhage or hematoma.
Patient tolerated biopsy well.
FINDINGS: Imaging confirms needle placement in the left renal mass for core
biopsy
IMPRESSION: Successful ultrasound left renal mass 18 gauge core biopsy

## 2020-10-19 MED ORDER — CHLORHEXIDINE GLUCONATE CLOTH 2 % EX PADS
6.0000 | MEDICATED_PAD | Freq: Once | CUTANEOUS | Status: AC
  Administered 2020-10-20: 6 via TOPICAL

## 2020-10-19 MED ORDER — VANCOMYCIN HCL 1000 MG/200ML IV SOLN: 1000.0000 mg | INTRAVENOUS | Status: DC

## 2020-10-19 MED ORDER — VANCOMYCIN HCL 1000 MG/200ML IV SOLN
1000.0000 mg | INTRAVENOUS | Status: AC
  Administered 2020-10-20: 1000 mg via INTRAVENOUS
  Filled 2020-10-19: qty 200

## 2020-10-19 MED ORDER — CHLORHEXIDINE GLUCONATE CLOTH 2 % EX PADS
6.0000 | MEDICATED_PAD | Freq: Once | CUTANEOUS | Status: AC
  Administered 2020-10-19: 6 via TOPICAL

## 2020-10-19 NOTE — Progress Notes (Signed)
Pt admitted to unit, vitals taken WNL, patient resting comfortably, initial assessment completed, no issues or concerns, pt reports no pain.

## 2020-10-19 NOTE — Progress Notes (Addendum)
PROGRESS NOTE    Kenneth Grant  HEN:[id number] DOB: 1961-03-18 DOA: 10/15/2020 PCP: Pcp, No    Brief Narrative:  60 year old incarcerated male with no significant past medical history who was brought to the hospital after an episode of syncope  -Reported left hand weakness, decreased hearing and slurred speech and headache for about 24 hours.  -CT brain report noted right frontal lobe lesion with vasogenic edema, CT abdomen pelvis noted large left kidney mass consistent with renal cell carcinoma  -Brain MRI with solitary hypervascular and hypercellular.  2.4 cm density mass in the right middle frontal gyrus with abundant regional vasogenic edema -Neurosurgery and radiation oncology consulted -CT Abd was concerning for possible RCC, large L renal mass -Underwent renal biopsy 5/25 -completed SRS on 10/19/20  -plan for surgical resection on 10/20/20.   Assessment & Plan:  Metastatic cancer  Suspected RCC as primary  Large solitary brain metastasis  -MRI brain notes solitary enhancing 2.4 cm mass with significant edema in the right middle frontal gyrus -CT abdomen pelvis notes large left renal mass most consistent with RCC -Underwent left renal biopsy 5/25, path pending -Neurosurgery (Dr. Zada Finders) and radiation oncology following -Continue Keppra and dexamethasone -Completed SRS today in radiation oncology -Dr. Lisbeth Renshaw -Transfer to [facility]  Syncope, suspected seizure  -Now stable, tolerating Keppra and dexamethasone -Management as above  Tobacco abuse/ anxiety.  -Continue PRN lorazepam.    Reactive leukocytosis. Likely related to systemic steroids,    DVT prophylaxis: Enoxaparin held for biopsy Code Status:   full  Family Communication: d/w pt in detail   Remains inpatient appropriate because:severity of illness  Dispo: The patient is from: Home              Anticipated d/c is to: Home              Patient currently is not medically stable to d/c.   Difficult to place  patient No  Consultants:   Oncology  Radiation Oncology   Neurosurgery   IR   Procedures: [facility] and radiation oncology Dr. Lisbeth Renshaw 5/26   Subjective: -Feels okay, no specific complaints today, about to go down for SRS  Objective: Vitals:   10/18/20 1844 10/18/20 2233 10/19/20 0607 10/19/20 1434  BP: 103/65 121/74 117/70 (!) 154/75  Pulse: 69 74 66 86  Resp: 16 18 18 17   Temp: 97.7 F (36.5 C) 98 F (36.7 C) 98 F (36.7 C) 97.7 F (36.5 C)  TempSrc: Oral Oral Oral Oral  SpO2: 94% 96% 98% 97%  Weight:      Height:        Intake/Output Summary (Last 24 hours) at 10/19/2020 1446 Last data filed at 10/19/2020 1439 Gross per 24 hour  Intake 360 ml  Output --  Net 360 ml   Filed Weights   10/15/20 0100  Weight: 83.9 kg    Examination:   General: Middle-aged male laying in bed, awake alert oriented x3, no distress HEENT: Mild left facial droop CVS: S1-S2, regular rate rhythm Lungs: Clear bilaterally Abdomen: Soft, nontender, bowel sounds present Neuro: Mild left facial droop and left hand wrist drop Skin. No rashes on exposed skin  Data Reviewed: I have personally reviewed following labs and imaging studies  CBC: Recent Labs  Lab 10/15/20 0202 10/16/20 0605 10/17/20 0527 10/18/20 0542 10/19/20 0511  WBC 9.7 17.0* 17.1* 14.3* 10.9*  NEUTROABS 6.1  --   --   --   --   HGB 13.4 13.3 12.8* 13.3 13.5  HCT 39.3  39.7 38.4* 40.4 40.9  MCV 83.1 85.2 85.3 85.4 84.2  PLT 207 232 221 214 290   Basic Metabolic Panel: Recent Labs  Lab 10/15/20 0202 10/16/20 0605 10/17/20 0527  NA 137 135 137  K 4.0 5.4* 4.7  CL 103 104 105  CO2 25 23 24   GLUCOSE 107* 140* 144*  BUN 16 28* 32*  CREATININE 0.90 1.01 0.88  CALCIUM 9.4 9.3 9.2   GFR: Estimated Creatinine Clearance: 96.8 mL/min (by C-G formula based on SCr of 0.88 mg/dL). Liver Function Tests: No results for input(s): AST, ALT, ALKPHOS, BILITOT, PROT, ALBUMIN in the last 168 hours. No results for input(s):  LIPASE, AMYLASE in the last 168 hours. No results for input(s): AMMONIA in the last 168 hours. Coagulation Profile: Recent Labs  Lab 10/17/20 0527  INR 1.0   Cardiac Enzymes: No results for input(s): CKTOTAL, CKMB, CKMBINDEX, TROPONINI in the last 168 hours. BNP (last 3 results) No results for input(s): PROBNP in the last 8760 hours. HbA1C: No results for input(s): HGBA1C in the last 72 hours. CBG: No results for input(s): GLUCAP in the last 168 hours. Lipid Profile: No results for input(s): CHOL, HDL, LDLCALC, TRIG, CHOLHDL, LDLDIRECT in the last 72 hours. Thyroid Function Tests: No results for input(s): TSH, T4TOTAL, FREET4, T3FREE, THYROIDAB in the last 72 hours. Anemia Panel: No results for input(s): VITAMINB12, FOLATE, FERRITIN, TIBC, IRON, RETICCTPCT in the last 72 hours.  Scheduled Meds: . dexamethasone  4 mg Oral Q8H  . levETIRAcetam  500 mg Oral BID  . sodium chloride flush  3 mL Intravenous Q12H   Continuous Infusions:   LOS: 4 days   Domenic Polite, MD

## 2020-10-20 ENCOUNTER — Inpatient Hospital Stay (HOSPITAL_COMMUNITY)

## 2020-10-20 ENCOUNTER — Encounter (HOSPITAL_COMMUNITY): Payer: Self-pay | Admitting: Internal Medicine

## 2020-10-20 ENCOUNTER — Encounter (HOSPITAL_COMMUNITY): Admission: EM | Disposition: A | Discharge status: Home / Self Care | Payer: Self-pay | Attending: Internal Medicine

## 2020-10-20 DIAGNOSIS — C799 Secondary malignant neoplasm of unspecified site: Secondary | ICD-10-CM

## 2020-10-20 DIAGNOSIS — N2889 Other specified disorders of kidney and ureter: Secondary | ICD-10-CM | POA: Diagnosis not present

## 2020-10-20 DIAGNOSIS — R55 Syncope and collapse: Secondary | ICD-10-CM | POA: Diagnosis not present

## 2020-10-20 DIAGNOSIS — D496 Neoplasm of unspecified behavior of brain: Secondary | ICD-10-CM | POA: Diagnosis present

## 2020-10-20 DIAGNOSIS — G9389 Other specified disorders of brain: Secondary | ICD-10-CM | POA: Diagnosis not present

## 2020-10-20 HISTORY — PX: APPLICATION OF CRANIAL NAVIGATION: SHX6578

## 2020-10-20 HISTORY — PX: CRANIOTOMY: SHX93

## 2020-10-20 LAB — CREATININE, SERUM
Creatinine, Ser: 0.81 mg/dL (ref 0.61–1.24)
GFR, Estimated: >60 mL/min (ref >60)

## 2020-10-20 LAB — CBC
HCT: 39.4 % (ref 39.0–52.0)
HCT: 40.7 % (ref 39.0–52.0)
Hemoglobin: 13.4 g/dL (ref 13.0–17.0)
Hemoglobin: 13.9 g/dL (ref 13.0–17.0)
MCH: 28 pg (ref 26.0–34.0)
MCH: 28.3 pg (ref 26.0–34.0)
MCHC: 34 g/dL (ref 30.0–36.0)
MCHC: 34.2 g/dL (ref 30.0–36.0)
MCV: 82.3 fL (ref 80.0–100.0)
MCV: 82.7 fL (ref 80.0–100.0)
Platelets: 224 10*3/uL (ref 150–400)
Platelets: 232 10*3/uL (ref 150–400)
RBC: 4.79 MIL/uL (ref 4.22–5.81)
RBC: 4.92 MIL/uL (ref 4.22–5.81)
RDW: 14.2 % (ref 11.5–15.5)
RDW: 14.3 % (ref 11.5–15.5)
WBC: 14 10*3/uL — ABNORMAL HIGH (ref 4.0–10.5)
WBC: 21.1 10*3/uL — ABNORMAL HIGH (ref 4.0–10.5)
nRBC: 0 % (ref 0.0–0.2)
nRBC: 0 % (ref 0.0–0.2)

## 2020-10-20 SURGERY — CRANIOTOMY TUMOR EXCISION
Anesthesia: General | Site: Head | Laterality: Right

## 2020-10-20 MED ORDER — ROCURONIUM BROMIDE 10 MG/ML (PF) SYRINGE
PREFILLED_SYRINGE | INTRAVENOUS | Status: AC
  Filled 2020-10-20: qty 10

## 2020-10-20 MED ORDER — PROMETHAZINE HCL 25 MG/ML IJ SOLN
INTRAMUSCULAR | Status: AC
  Filled 2020-10-20: qty 1

## 2020-10-20 MED ORDER — HYDROMORPHONE HCL 1 MG/ML IJ SOLN
INTRAMUSCULAR | Status: AC
  Administered 2020-10-20: 0.25 mg via INTRAVENOUS
  Filled 2020-10-20: qty 1

## 2020-10-20 MED ORDER — HYDROMORPHONE HCL 1 MG/ML IJ SOLN
0.2500 mg | INTRAMUSCULAR | Status: DC | PRN
  Administered 2020-10-20: 0.25 mg via INTRAVENOUS

## 2020-10-20 MED ORDER — HYDROCODONE-ACETAMINOPHEN 5-325 MG PO TABS
1.0000 | ORAL_TABLET | ORAL | Status: DC | PRN
  Administered 2020-10-21: 1 via ORAL
  Filled 2020-10-20: qty 1

## 2020-10-20 MED ORDER — ACETAMINOPHEN 650 MG RE SUPP: 650.0000 mg | RECTAL | Status: DC | PRN

## 2020-10-20 MED ORDER — PROPOFOL 10 MG/ML IV BOLUS
INTRAVENOUS | Status: DC | PRN
  Administered 2020-10-20: 30 mg via INTRAVENOUS
  Administered 2020-10-20: 20 mg via INTRAVENOUS
  Administered 2020-10-20: 100 mg via INTRAVENOUS

## 2020-10-20 MED ORDER — MEPERIDINE HCL 25 MG/ML IJ SOLN: 6.2500 mg | INTRAMUSCULAR | Status: DC | PRN

## 2020-10-20 MED ORDER — PROMETHAZINE HCL 12.5 MG PO TABS
12.5000 mg | ORAL_TABLET | ORAL | Status: DC | PRN
Start: 2020-10-20 — End: 2020-10-22
  Filled 2020-10-20: qty 2

## 2020-10-20 MED ORDER — DOCUSATE SODIUM 100 MG PO CAPS
100.0000 mg | ORAL_CAPSULE | Freq: Two times a day (BID) | ORAL | Status: DC
  Administered 2020-10-20 – 2020-10-22 (×3): 100 mg via ORAL
  Filled 2020-10-20 (×3): qty 1

## 2020-10-20 MED ORDER — HYDROMORPHONE HCL 1 MG/ML IJ SOLN: 0.5000 mg | INTRAMUSCULAR | Status: DC | PRN

## 2020-10-20 MED ORDER — ONDANSETRON HCL 4 MG/2ML IJ SOLN
INTRAMUSCULAR | Status: DC | PRN
  Administered 2020-10-20: 4 mg via INTRAVENOUS

## 2020-10-20 MED ORDER — CHLORHEXIDINE GLUCONATE 0.12 % MT SOLN
OROMUCOSAL | Status: AC
  Administered 2020-10-20: 15 mL via OROMUCOSAL
  Filled 2020-10-20: qty 15

## 2020-10-20 MED ORDER — SODIUM CHLORIDE 0.9 % IV SOLN: INTRAVENOUS | Status: DC

## 2020-10-20 MED ORDER — THROMBIN 5000 UNITS EX SOLR: OROMUCOSAL | Status: DC | PRN

## 2020-10-20 MED ORDER — SUGAMMADEX SODIUM 200 MG/2ML IV SOLN
INTRAVENOUS | Status: DC | PRN
  Administered 2020-10-20: 200 mg via INTRAVENOUS

## 2020-10-20 MED ORDER — FENTANYL CITRATE (PF) 250 MCG/5ML IJ SOLN
INTRAMUSCULAR | Status: AC
  Filled 2020-10-20: qty 5

## 2020-10-20 MED ORDER — OXYCODONE HCL 5 MG/5ML PO SOLN
5.0000 mg | Freq: Once | ORAL | Status: DC | PRN
Start: 2020-10-20 — End: 2020-10-20

## 2020-10-20 MED ORDER — BACITRACIN ZINC 500 UNIT/GM EX OINT
TOPICAL_OINTMENT | CUTANEOUS | Status: AC
  Filled 2020-10-20: qty 28.35

## 2020-10-20 MED ORDER — FENTANYL CITRATE (PF) 250 MCG/5ML IJ SOLN
INTRAMUSCULAR | Status: DC | PRN
  Administered 2020-10-20: 100 ug via INTRAVENOUS
  Administered 2020-10-20: 50 ug via INTRAVENOUS
  Administered 2020-10-20: 100 ug via INTRAVENOUS

## 2020-10-20 MED ORDER — HYDROMORPHONE HCL 1 MG/ML IJ SOLN
INTRAMUSCULAR | Status: DC | PRN
  Administered 2020-10-20: .5 mg via INTRAVENOUS

## 2020-10-20 MED ORDER — DEXMEDETOMIDINE (PRECEDEX) IN NS 20 MCG/5ML (4 MCG/ML) IV SYRINGE
PREFILLED_SYRINGE | INTRAVENOUS | Status: DC | PRN
  Administered 2020-10-20: 12 ug via INTRAVENOUS
  Administered 2020-10-20: 8 ug via INTRAVENOUS

## 2020-10-20 MED ORDER — MANNITOL 25 % IV SOLN
INTRAVENOUS | Status: DC | PRN
  Administered 2020-10-20: 41.94 g via INTRAVENOUS

## 2020-10-20 MED ORDER — BACITRACIN ZINC 500 UNIT/GM EX OINT
TOPICAL_OINTMENT | CUTANEOUS | Status: DC | PRN
  Administered 2020-10-20 (×2): 1 via TOPICAL

## 2020-10-20 MED ORDER — ONDANSETRON HCL 4 MG/2ML IJ SOLN
INTRAMUSCULAR | Status: AC
  Filled 2020-10-20: qty 2

## 2020-10-20 MED ORDER — ROCURONIUM BROMIDE 10 MG/ML (PF) SYRINGE
PREFILLED_SYRINGE | INTRAVENOUS | Status: DC | PRN
  Administered 2020-10-20: 60 mg via INTRAVENOUS
  Administered 2020-10-20: 20 mg via INTRAVENOUS
  Administered 2020-10-20: 40 mg via INTRAVENOUS

## 2020-10-20 MED ORDER — CHLORHEXIDINE GLUCONATE 0.12 % MT SOLN
15.0000 mL | OROMUCOSAL | Status: AC
  Filled 2020-10-20: qty 15

## 2020-10-20 MED ORDER — POLYETHYLENE GLYCOL 3350 17 G PO PACK: 17.0000 g | PACK | Freq: Every day | ORAL | Status: DC | PRN

## 2020-10-20 MED ORDER — PHENYLEPHRINE HCL-NACL 10-0.9 MG/250ML-% IV SOLN
INTRAVENOUS | Status: DC | PRN
  Administered 2020-10-20: 40 ug/min via INTRAVENOUS

## 2020-10-20 MED ORDER — LIDOCAINE-EPINEPHRINE 1 %-1:100000 IJ SOLN
INTRAMUSCULAR | Status: DC | PRN
  Administered 2020-10-20: 10 mL

## 2020-10-20 MED ORDER — EPHEDRINE SULFATE-NACL 50-0.9 MG/10ML-% IV SOSY
PREFILLED_SYRINGE | INTRAVENOUS | Status: DC | PRN
  Administered 2020-10-20: 10 mg via INTRAVENOUS

## 2020-10-20 MED ORDER — PANTOPRAZOLE SODIUM 40 MG PO TBEC
40.0000 mg | DELAYED_RELEASE_TABLET | Freq: Every day | ORAL | Status: DC
  Administered 2020-10-20 – 2020-10-22 (×3): 40 mg via ORAL
  Filled 2020-10-20 (×4): qty 1

## 2020-10-20 MED ORDER — DEXAMETHASONE SODIUM PHOSPHATE 10 MG/ML IJ SOLN
INTRAMUSCULAR | Status: DC | PRN
  Administered 2020-10-20: 10 mg via INTRAVENOUS

## 2020-10-20 MED ORDER — ONDANSETRON HCL 4 MG PO TABS: 4.0000 mg | ORAL_TABLET | ORAL | Status: DC | PRN

## 2020-10-20 MED ORDER — THROMBIN 20000 UNITS EX SOLR: CUTANEOUS | Status: DC | PRN

## 2020-10-20 MED ORDER — EPHEDRINE 5 MG/ML INJ
INTRAVENOUS | Status: AC
  Filled 2020-10-20: qty 10

## 2020-10-20 MED ORDER — OXYCODONE HCL 5 MG PO TABS
5.0000 mg | ORAL_TABLET | Freq: Once | ORAL | Status: DC | PRN
Start: 2020-10-20 — End: 2020-10-20

## 2020-10-20 MED ORDER — PROPOFOL 10 MG/ML IV BOLUS
INTRAVENOUS | Status: AC
  Filled 2020-10-20: qty 20

## 2020-10-20 MED ORDER — THROMBIN 5000 UNITS EX SOLR
CUTANEOUS | Status: AC
  Filled 2020-10-20: qty 5000

## 2020-10-20 MED ORDER — THROMBIN 20000 UNITS EX SOLR
CUTANEOUS | Status: AC
  Filled 2020-10-20: qty 20000

## 2020-10-20 MED ORDER — ACETAMINOPHEN 325 MG PO TABS
650.0000 mg | ORAL_TABLET | ORAL | Status: DC | PRN
  Administered 2020-10-20: 650 mg via ORAL
  Filled 2020-10-20 (×2): qty 2

## 2020-10-20 MED ORDER — VANCOMYCIN HCL IN DEXTROSE 1-5 GM/200ML-% IV SOLN
INTRAVENOUS | Status: AC
  Filled 2020-10-20: qty 200

## 2020-10-20 MED ORDER — 0.9 % SODIUM CHLORIDE (POUR BTL) OPTIME
TOPICAL | Status: DC | PRN
  Administered 2020-10-20 (×3): 1000 mL

## 2020-10-20 MED ORDER — LIDOCAINE 2% (20 MG/ML) 5 ML SYRINGE
INTRAMUSCULAR | Status: AC
  Filled 2020-10-20: qty 5

## 2020-10-20 MED ORDER — ONDANSETRON HCL 4 MG/2ML IJ SOLN: 4.0000 mg | INTRAMUSCULAR | Status: DC | PRN

## 2020-10-20 MED ORDER — HEPARIN SODIUM (PORCINE) 5000 UNIT/ML IJ SOLN
5000.0000 [IU] | Freq: Three times a day (TID) | INTRAMUSCULAR | Status: DC
  Administered 2020-10-22: 5000 [IU] via SUBCUTANEOUS
  Filled 2020-10-20 (×2): qty 1

## 2020-10-20 MED ORDER — LIDOCAINE 2% (20 MG/ML) 5 ML SYRINGE
INTRAMUSCULAR | Status: DC | PRN
  Administered 2020-10-20: 20 mg via INTRAVENOUS
  Administered 2020-10-20: 40 mg via INTRAVENOUS

## 2020-10-20 MED ORDER — HYDROMORPHONE HCL 1 MG/ML IJ SOLN
INTRAMUSCULAR | Status: AC
  Filled 2020-10-20: qty 0.5

## 2020-10-20 MED ORDER — MIDAZOLAM HCL 2 MG/2ML IJ SOLN: 0.5000 mg | Freq: Once | INTRAMUSCULAR | Status: DC | PRN

## 2020-10-20 MED ORDER — PROMETHAZINE HCL 25 MG/ML IJ SOLN
6.2500 mg | INTRAMUSCULAR | Status: DC | PRN
  Administered 2020-10-20: 6.25 mg via INTRAVENOUS

## 2020-10-20 MED ORDER — SODIUM CHLORIDE 0.9 % IV SOLN: INTRAVENOUS | Status: DC | PRN

## 2020-10-20 MED ORDER — CEFAZOLIN SODIUM-DEXTROSE 2-4 GM/100ML-% IV SOLN
2.0000 g | Freq: Three times a day (TID) | INTRAVENOUS | Status: AC
  Administered 2020-10-20 – 2020-10-21 (×2): 2 g via INTRAVENOUS
  Filled 2020-10-20 (×2): qty 100

## 2020-10-20 MED ORDER — CHLORHEXIDINE GLUCONATE CLOTH 2 % EX PADS
6.0000 | MEDICATED_PAD | Freq: Every day | CUTANEOUS | Status: DC
  Administered 2020-10-20 – 2020-10-21 (×2): 6 via TOPICAL

## 2020-10-20 MED ORDER — LABETALOL HCL 5 MG/ML IV SOLN
10.0000 mg | INTRAVENOUS | Status: DC | PRN
Start: 2020-10-20 — End: 2020-10-22

## 2020-10-20 MED ORDER — DEXAMETHASONE SODIUM PHOSPHATE 10 MG/ML IJ SOLN
INTRAMUSCULAR | Status: AC
  Filled 2020-10-20: qty 1

## 2020-10-20 MED ORDER — LIDOCAINE-EPINEPHRINE 1 %-1:100000 IJ SOLN
INTRAMUSCULAR | Status: AC
  Filled 2020-10-20: qty 1

## 2020-10-20 SURGICAL SUPPLY — 86 items
BAND RUBBER #18 3X1/16 STRL (MISCELLANEOUS) ×6 IMPLANT
BENZOIN TINCTURE PRP APPL 2/3 (GAUZE/BANDAGES/DRESSINGS) IMPLANT
BLADE CLIPPER SURG (BLADE) ×3 IMPLANT
BLADE SAW GIGLI 16 STRL (MISCELLANEOUS) IMPLANT
BLADE SURG 15 STRL LF DISP TIS (BLADE) IMPLANT
BLADE SURG 15 STRL SS (BLADE)
BNDG GAUZE ELAST 4 BULKY (GAUZE/BANDAGES/DRESSINGS) IMPLANT
BNDG STRETCH 4X75 STRL LF (GAUZE/BANDAGES/DRESSINGS) IMPLANT
BUR ACORN 9.0 PRECISION (BURR) ×3 IMPLANT
BUR ROUND FLUTED 4 SOFT TCH (BURR) IMPLANT
BUR SPIRAL ROUTER 2.3 (BUR) ×3 IMPLANT
CANISTER SUCT 3000ML PPV (MISCELLANEOUS) ×6 IMPLANT
CATH VENTRIC 35X38 W/TROCAR LG (CATHETERS) IMPLANT
CLIP VESOCCLUDE MED 6/CT (CLIP) IMPLANT
CNTNR URN SCR LID CUP LEK RST (MISCELLANEOUS) ×2 IMPLANT
CONT SPEC 4OZ STRL OR WHT (MISCELLANEOUS) ×1
COVER MAYO STAND STRL (DRAPES) IMPLANT
COVER WAND RF STERILE (DRAPES) IMPLANT
DECANTER SPIKE VIAL GLASS SM (MISCELLANEOUS) IMPLANT
DRAIN SUBARACHNOID (WOUND CARE) IMPLANT
DRAPE HALF SHEET 40X57 (DRAPES) ×3 IMPLANT
DRAPE MICROSCOPE LEICA (MISCELLANEOUS) ×3 IMPLANT
DRAPE NEUROLOGICAL W/INCISE (DRAPES) ×3 IMPLANT
DRAPE STERI IOBAN 125X83 (DRAPES) IMPLANT
DRAPE SURG 17X23 STRL (DRAPES) IMPLANT
DRAPE WARM FLUID 44X44 (DRAPES) ×3 IMPLANT
DRSG ADAPTIC 3X8 NADH LF (GAUZE/BANDAGES/DRESSINGS) IMPLANT
DRSG TELFA 3X8 NADH (GAUZE/BANDAGES/DRESSINGS) IMPLANT
DURAPREP 6ML APPLICATOR 50/CS (WOUND CARE) ×3 IMPLANT
ELECT REM PT RETURN 9FT ADLT (ELECTROSURGICAL) ×3
ELECTRODE REM PT RTRN 9FT ADLT (ELECTROSURGICAL) ×2 IMPLANT
EVACUATOR 1/8 PVC DRAIN (DRAIN) IMPLANT
EVACUATOR SILICONE 100CC (DRAIN) IMPLANT
FORCEPS BIPOLAR SPETZLER 8 1.0 (NEUROSURGERY SUPPLIES) ×3 IMPLANT
GAUZE 4X4 16PLY RFD (DISPOSABLE) IMPLANT
GAUZE SPONGE 4X4 12PLY STRL (GAUZE/BANDAGES/DRESSINGS) IMPLANT
GLOVE BIO SURGEON STRL SZ7 (GLOVE) IMPLANT
GLOVE BIOGEL PI IND STRL 7.5 (GLOVE) ×2 IMPLANT
GLOVE BIOGEL PI INDICATOR 7.5 (GLOVE) ×1
GLOVE ECLIPSE 7.5 STRL STRAW (GLOVE) ×6 IMPLANT
GLOVE EXAM NITRILE LRG STRL (GLOVE) IMPLANT
GLOVE EXAM NITRILE XS STR PU (GLOVE) IMPLANT
GLOVE SURG UNDER POLY LF SZ7 (GLOVE) IMPLANT
GOWN STRL REUS W/ TWL LRG LVL3 (GOWN DISPOSABLE) ×6 IMPLANT
GOWN STRL REUS W/ TWL XL LVL3 (GOWN DISPOSABLE) ×2 IMPLANT
GOWN STRL REUS W/TWL 2XL LVL3 (GOWN DISPOSABLE) IMPLANT
GOWN STRL REUS W/TWL LRG LVL3 (GOWN DISPOSABLE) ×3
GOWN STRL REUS W/TWL XL LVL3 (GOWN DISPOSABLE) ×1
HEMOSTAT POWDER KIT SURGIFOAM (HEMOSTASIS) ×3 IMPLANT
HEMOSTAT SURGICEL 2X14 (HEMOSTASIS) IMPLANT
IV NS 1000ML (IV SOLUTION)
IV NS 1000ML BAXH (IV SOLUTION) IMPLANT
KIT BASIN OR (CUSTOM PROCEDURE TRAY) ×3 IMPLANT
KIT DRAIN CSF ACCUDRAIN (MISCELLANEOUS) IMPLANT
KIT TURNOVER KIT B (KITS) ×3 IMPLANT
MARKER SPHERE PSV REFLC 13MM (MARKER) IMPLANT
NEEDLE HYPO 22GX1.5 SAFETY (NEEDLE) ×3 IMPLANT
NEEDLE SPNL 18GX3.5 QUINCKE PK (NEEDLE) IMPLANT
NS IRRIG 1000ML POUR BTL (IV SOLUTION) ×9 IMPLANT
PACK CRANIOTOMY CUSTOM (CUSTOM PROCEDURE TRAY) ×3 IMPLANT
PATTIES SURGICAL .25X.25 (GAUZE/BANDAGES/DRESSINGS) IMPLANT
PATTIES SURGICAL .5 X.5 (GAUZE/BANDAGES/DRESSINGS) IMPLANT
PATTIES SURGICAL .5 X3 (DISPOSABLE) IMPLANT
PATTIES SURGICAL 1/4 X 3 (GAUZE/BANDAGES/DRESSINGS) IMPLANT
PATTIES SURGICAL 1X1 (DISPOSABLE) IMPLANT
PIN MAYFIELD SKULL DISP (PIN) ×3 IMPLANT
PLATE DOUBLE Y CMF 6H (Plate) ×9 IMPLANT
SCREW UNIII AXS SD 1.5X4 (Screw) ×36 IMPLANT
SPECIMEN JAR SMALL (MISCELLANEOUS) IMPLANT
SPONGE NEURO XRAY DETECT 1X3 (DISPOSABLE) IMPLANT
SPONGE SURGIFOAM ABS GEL 100 (HEMOSTASIS) ×3 IMPLANT
STAPLER VISISTAT 35W (STAPLE) ×3 IMPLANT
SUT ETHILON 3 0 FSL (SUTURE) IMPLANT
SUT ETHILON 3 0 PS 1 (SUTURE) ×3 IMPLANT
SUT MNCRL AB 3-0 PS2 18 (SUTURE) IMPLANT
SUT MON AB 3-0 SH 27 (SUTURE)
SUT MON AB 3-0 SH27 (SUTURE) IMPLANT
SUT NURALON 4 0 TR CR/8 (SUTURE) ×3 IMPLANT
SUT SILK 0 TIES 10X30 (SUTURE) IMPLANT
SUT VIC AB 2-0 CP2 18 (SUTURE) ×3 IMPLANT
TOWEL GREEN STERILE (TOWEL DISPOSABLE) ×3 IMPLANT
TOWEL GREEN STERILE FF (TOWEL DISPOSABLE) ×3 IMPLANT
TRAY FOLEY MTR SLVR 16FR STAT (SET/KITS/TRAYS/PACK) ×3 IMPLANT
TUBE CONNECTING 12X1/4 (SUCTIONS) ×3 IMPLANT
UNDERPAD 30X36 HEAVY ABSORB (UNDERPADS AND DIAPERS) ×3 IMPLANT
WATER STERILE IRR 1000ML POUR (IV SOLUTION) ×3 IMPLANT

## 2020-10-20 NOTE — Progress Notes (Signed)
Neurosurgery Service Post-operative progress note  Assessment & Plan: 60 y.o. man s/p R crani for tumor resection, seen in PACU, stable LUE weakness and facial droop, at preop baseline.  -admit to 4N ICU -continue dex -MRI w/wo contrast today or tomorrow -diet and activity as tolerated  Judith Part  10/20/20 3:17 PM

## 2020-10-20 NOTE — Op Note (Signed)
PATIENT: Kenneth Grant  DAY OF SURGERY: 10/20/20   PRE-OPERATIVE DIAGNOSIS:  Right frontal brain tumor   POST-OPERATIVE DIAGNOSIS:  Same   PROCEDURE:  Right craniotomy for tumor resection, use of frameless stereotaxy   SURGEON:  Surgeon(s) and Role:    Judith Part, MD - Primary   ANESTHESIA: ETGA   BRIEF HISTORY: This is a 60 year old man who presented with a likely seizure. The patient was found to have a likely renal primary with a right frontal brain met with impressive surrounding edema. Given that it was his only intracranial disease, I recommended surgical resection with preop SRS. This was discussed with the patient as well as risks, benefits, and alternatives and wished to proceed with surgery.   OPERATIVE DETAIL: The patient was taken to the operating room and placed on the OR table in the supine position. A formal time out was performed with two patient identifiers and confirmed the operative site. Anesthesia was induced by the anesthesia team. The [place] head holder was applied to the head and a registration array was attached to the [place]. This was co-registered with the patient's preoperative imaging, the fit appeared to be acceptable. Using frameless stereotaxy, the operative trajectory was planned and the incision was marked. Hair was clipped with surgical clippers over the incision and the area was then prepped and draped in a sterile fashion.  A linear incision was placed in the right frontal region. Soft tissues were dissected, retractors placed, and a craniotomy flap was turned. The dura was opened and navigation was used to guide the operative corridor to the tumor. It was then dissected circumferentially en bloc and removed. It was fairly vascular for a met, which was not unexpected. As I was removing it with large cupped biopsy forceps, the tumor stroma came apart so it was sent to pathology in pieces. The cavity was inspected without any obvious tumor left,  which was reflected on stereotactic navigation. I therefore confirmed hemostasis, thoroughly irrigated, closed the dura, and replaced the bone flap with titanium plates and screws.  All instrument and sponge counts were correct, the incision was then closed in layers. The patient was then returned to anesthesia for emergence. No apparent complications at the completion of the procedure.   EBL:  148m   DRAINS: none   SPECIMENS: Right brain tumor   TJudith Part MD 10/20/20 11:15 AM

## 2020-10-20 NOTE — TOC Initial Note (Signed)
Transition of Care [facility]) - Initial/Assessment Note    Patient Details  Name: Kenneth Grant MRN: [id number] Date of Birth: 01/14/1961  Transition of Care [facility]) CM/SW Contact:    Pollie Friar, RN Phone Number: 10/20/2020, 10:57 AM  Clinical Narrative:                 Patient to have surgery today for brain mass. CM met with him and his ex-wife and daughter at the bedside. He has some confusion but ex-wife assisting with answers.  Pt is coming from prison but has been released and is on probation. Per ex-wife before that he was living at home with a girlfriend.  Ex-wife states he can come and stay with her at d/c or return to his home with girlfriend and other people to assist.  Pt denies taking any medications prior to hospitalization.  He denies any DME at home. States he was working prior to prison. Ex-wife state he will have support with transportation and supervision at d/c.  Pt is without insurance and no PCP. Pt and ex-wife interested in getting him an appt at one of the [facility]. CM will follow and arrange closer to d/c. TOC following.  Expected Discharge Plan: Home/Self Care Barriers to Discharge: Continued Medical Work up   Patient Goals and CMS Choice        Expected Discharge Plan and Services Expected Discharge Plan: Home/Self Care   Discharge Planning Services: CM Consult   Living arrangements for the past 2 months: Single Family Home                                      Prior Living Arrangements/Services Living arrangements for the past 2 months: Single Family Home Lives with:: Other (Comment) (girlfriend) Patient language and need for interpreter reviewed:: Yes Do you feel safe going back to the place where you live?: Yes      Need for Family Participation in Patient Care: Yes (Comment) Care giver support system in place?: Yes (comment)   Criminal Activity/Legal Involvement Pertinent to Current Situation/Hospitalization: Yes - Comment as  needed (pt released from federal custody and is on probation)  Activities of Daily Living Home Assistive Devices/Equipment: Bathtub lift ADL Screening (condition at time of admission) Patient's cognitive ability adequate to safely complete daily activities?: Yes Is the patient deaf or have difficulty hearing?: Yes Does the patient have difficulty seeing, even when wearing glasses/contacts?: No Does the patient have difficulty concentrating, remembering, or making decisions?: No Patient able to express need for assistance with ADLs?: No Does the patient have difficulty dressing or bathing?: No Independently performs ADLs?: No Communication: Independent Does the patient have difficulty walking or climbing stairs?: No Weakness of Legs: None Weakness of Arms/Hands: Left  Permission Sought/Granted                  Emotional Assessment Appearance:: Appears stated age Attitude/Demeanor/Rapport: Engaged Affect (typically observed): Anxious Orientation: : Oriented to Self,Oriented to Place   Psych Involvement: No (comment)  Admission diagnosis:  Brain mass [G93.89] Mass of left frontal lobe [G93.89] Injury of left thumb, initial encounter [G29.52WU] Syncope [R55] Patient Active Problem List   Diagnosis Date Noted  . Renal cell carcinoma of left kidney ([place]) 10/16/2020  . Brain metastasis ([place]) 10/16/2020  . Left renal mass 10/16/2020  . Metastatic neoplastic disease ([place]) 10/16/2020  . Mass of right frontal lobe 10/15/2020  .  Syncope 10/15/2020  . Tobacco use 10/15/2020   PCP:  Pcp, No Pharmacy:  No Pharmacies Listed    Social Determinants of Health (SDOH) Interventions    Readmission Risk Interventions No flowsheet data found.

## 2020-10-20 NOTE — Progress Notes (Signed)
PROGRESS NOTE    Kenneth Grant  ZWC:[id number] DOB: 09-12-1960 DOA: 10/15/2020 PCP: Pcp, No   Brief Narrative:  HPI on 10/15/2020 by Dr. Marva Panda This is a 59 year old male with no known past medical history who presents from jail with two marshals after he had an episode of dizziness and syncopal episode yesterday and hit his head. He says he was in his usual state of health up until that point when he was eating lunch, went to stand up and lost consciousness. Also complaining of weakness of the left hand, decreased hearing and slurred speech since yesterday as well around the same time.  Admits to moderate headache. Has a history of tobacco use for the past 20 years, changed from cigarettes to dip 5 years ago. Family history of lung cancer in father in his 20s. He denies any night sweats, weight changes, cough, dysuria or hematuria or any other complaints at this time.    Interim history Patient admitted to [facility] long for syncope.  Patient had also complained of left hand weakness and decreased hearing slurred speech and headache.  CT head showed right frontal lobe lesion with vasogenic edema.  CT abdomen pelvis noted a large left kidney mass consistent with renal cell carcinoma.  Brain MRI showed a solitary hypervascular and hypercellular 2.4 cm density.  Radiation oncology was consulted, patient underwent SRS on 5/26.  Also had renal biopsy on 5/25.  Patient was sent to [facility] for surgical resection of mass which is to occur today, 5/27. 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 -CT head showed right frontal lobe lesion with vasogenic edema -MRI brain showed solitary hypervascular and hypercellular 2.4 cm density -CT abdomen pelvis showed a large left kidney mass consistent with renal cell carcinoma -Status post left renal biopsy on 5/25-pathology pending -Radiation oncology consulted and appreciated, status post radiation oncology  on 5/26 -Neurosurgery consulted and appreciated, planning for resection today  Syncope with suspected seizure -Likely secondary to the above -Continue Keppra, dexamethasone  Tobacco abuse -Discussed cessation  Anxiety -Continue lorazepam as needed  Leukocytosis -Suspect reactive due to steroids -Continue to monitor CBC  DVT Prophylaxis SCDs  Code Status: Full  Family Communication: None at bedside  Disposition Plan:  Status is: Inpatient  Remains inpatient appropriate because:Inpatient level of care appropriate due to severity of illness   Dispo: The patient is from: [place]              Anticipated d/c is to: TBD              Patient currently is not medically stable to d/c.   Difficult to place patient No  Consultants Radiation oncology Oncology Neurosurgery Interventional radiology  Procedures  CT-guided left renal mass biopsy SRS   Antibiotics   Anti-infectives (From admission, onward)   Start     Dose/Rate Route Frequency Ordered Stop   10/20/20 0600  vancomycin (VANCOREADY) IVPB 1000 mg/200 mL  Status:  Discontinued        1,000 mg 200 mL/hr over 60 Minutes Intravenous On call to O.R. 10/19/20 1804 10/19/20 1850   10/20/20 0600  vancomycin (VANCOREADY) IVPB 1000 mg/200 mL        1,000 mg 200 mL/hr over 60 Minutes Intravenous On call 10/19/20 1851 10/21/20 0600      Subjective:   Kenneth Grant seen and examined today.  States he is feels better after speaking with the neurosurgeon.  Feels anxious about upcoming procedure.  Denies current chest pain or shortness of breath, abdominal pain, nausea or vomiting, any dizziness.  Objective:   Vitals:   10/19/20 2013 10/20/20 0023 10/20/20 0341 10/20/20 0757  BP: (!) 122/58 132/67 107/60 121/68  Pulse: 78 (!) 56 65 (!) 58  Resp: 18 18 18 18   Temp: (!) 97.5 F (36.4 C) (!) 97.4 F (36.3 C) 98 F (36.7 C) 98.2 F (36.8 C)  TempSrc: Oral Oral Oral Oral  SpO2: 96% 97% 95% 97%  Weight:      Height:         Intake/Output Summary (Last 24 hours) at 10/20/2020 0846 Last data filed at 10/19/2020 2120 Gross per 24 hour  Intake 481 ml  Output --  Net 481 ml   Filed Weights   10/15/20 0100  Weight: 83.9 kg    Exam  General: Well developed, well nourished, NAD, appears stated age  HEENT: NCAT, mucous membranes moist.   Neck: Supple  Cardiovascular: S1 S2 auscultated, RRR.  Respiratory: Clear to auscultation bilaterally, no wheezing  Abdomen: Soft, nontender, nondistended, + bowel sounds  Extremities: warm dry without cyanosis clubbing or edema  Neuro: AAOx3, diminished strength 4/5 LUE as well as bilateral lower extremities.  5/5 RUE.  LUE drift  Skin: Without rashes exudates or nodules  Psych: anxious   Data Reviewed: I have personally reviewed following labs and imaging studies  CBC: Recent Labs  Lab 10/15/20 0202 10/16/20 0605 10/17/20 0527 10/18/20 0542 10/19/20 0511 10/20/20 0248  WBC 9.7 17.0* 17.1* 14.3* 10.9* 14.0*  NEUTROABS 6.1  --   --   --   --   --   HGB 13.4 13.3 12.8* 13.3 13.5 13.4  HCT 39.3 39.7 38.4* 40.4 40.9 39.4  MCV 83.1 85.2 85.3 85.4 84.2 82.3  PLT 207 232 221 214 215 469   Basic Metabolic Panel: Recent Labs  Lab 10/15/20 0202 10/16/20 0605 10/17/20 0527  NA 137 135 137  K 4.0 5.4* 4.7  CL 103 104 105  CO2 25 23 24   GLUCOSE 107* 140* 144*  BUN 16 28* 32*  CREATININE 0.90 1.01 0.88  CALCIUM 9.4 9.3 9.2   GFR: Estimated Creatinine Clearance: 96.8 mL/min (by C-G formula based on SCr of 0.88 mg/dL). Liver Function Tests: No results for input(s): AST, ALT, ALKPHOS, BILITOT, PROT, ALBUMIN in the last 168 hours. No results for input(s): LIPASE, AMYLASE in the last 168 hours. No results for input(s): AMMONIA in the last 168 hours. Coagulation Profile: Recent Labs  Lab 10/17/20 0527  INR 1.0   Cardiac Enzymes: No results for input(s): CKTOTAL, CKMB, CKMBINDEX, TROPONINI in the last 168 hours. BNP (last 3 results) No results  for input(s): PROBNP in the last 8760 hours. HbA1C: No results for input(s): HGBA1C in the last 72 hours. CBG: No results for input(s): GLUCAP in the last 168 hours. Lipid Profile: No results for input(s): CHOL, HDL, LDLCALC, TRIG, CHOLHDL, LDLDIRECT in the last 72 hours. Thyroid Function Tests: No results for input(s): TSH, T4TOTAL, FREET4, T3FREE, THYROIDAB in the last 72 hours. Anemia Panel: No results for input(s): VITAMINB12, FOLATE, FERRITIN, TIBC, IRON, RETICCTPCT in the last 72 hours. Urine analysis: No results found for: COLORURINE, APPEARANCEUR, LABSPEC, PHURINE, GLUCOSEU, HGBUR, BILIRUBINUR, KETONESUR, PROTEINUR, UROBILINOGEN, NITRITE, LEUKOCYTESUR Sepsis Labs: @LABRCNTIP (procalcitonin:4,lacticidven:4)  ) Recent Results (from the past 240 hour(s))  Resp Panel by RT-PCR (Flu A&B, Covid) Nasopharyngeal Swab     Status: None   Collection Time: 10/15/20  4:11 AM   Specimen: Nasopharyngeal Swab; Nasopharyngeal(NP)  swabs in vial transport medium  Result Value Ref Range Status   SARS Coronavirus 2 by RT PCR NEGATIVE NEGATIVE Final    Comment: (NOTE) SARS-CoV-2 target nucleic acids are NOT DETECTED.  The SARS-CoV-2 RNA is generally detectable in upper respiratory specimens during the acute phase of infection. The lowest concentration of SARS-CoV-2 viral copies this assay can detect is 138 copies/mL. A negative result does not preclude SARS-Cov-2 infection and should not be used as the sole basis for treatment or other patient management decisions. A negative result may occur with  improper specimen collection/handling, submission of specimen other than nasopharyngeal swab, presence of viral mutation(s) within the areas targeted by this assay, and inadequate number of viral copies(<138 copies/mL). A negative result must be combined with clinical observations, patient history, and epidemiological information. The expected result is Negative.  Fact Sheet for Patients:   EntrepreneurPulse.com.au  Fact Sheet for Healthcare Providers:  IncredibleEmployment.be  This test is no t yet approved or cleared by the [place] FDA and  has been authorized for detection and/or diagnosis of SARS-CoV-2 by FDA under an Emergency Use Authorization (EUA). This EUA will remain  in effect (meaning this test can be used) for the duration of the COVID-19 declaration under Section 564(b)(1) of the Act, 21 U.S.C.section 360bbb-3(b)(1), unless the authorization is terminated  or revoked sooner.       Influenza A by PCR NEGATIVE NEGATIVE Final   Influenza B by PCR NEGATIVE NEGATIVE Final    Comment: (NOTE) The Xpert Xpress SARS-CoV-2/FLU/RSV plus assay is intended as an aid in the diagnosis of influenza from Nasopharyngeal swab specimens and should not be used as a sole basis for treatment. Nasal washings and aspirates are unacceptable for Xpert Xpress SARS-CoV-2/FLU/RSV testing.  Fact Sheet for Patients: EntrepreneurPulse.com.au  Fact Sheet for Healthcare Providers: IncredibleEmployment.be  This test is not yet approved or cleared by the [place] FDA and has been authorized for detection and/or diagnosis of SARS-CoV-2 by FDA under an Emergency Use Authorization (EUA). This EUA will remain in effect (meaning this test can be used) for the duration of the COVID-19 declaration under Section 564(b)(1) of the Act, 21 U.S.C. section 360bbb-3(b)(1), unless the authorization is terminated or revoked.  Performed at [facility], [place] [address] [place], [place] [address]   Surgical pcr screen     Status: None   Collection Time: 10/19/20  8:28 PM   Specimen: Nasal Mucosa; Nasal Swab  Result Value Ref Range Status   MRSA, PCR NEGATIVE NEGATIVE Final   Staphylococcus aureus NEGATIVE NEGATIVE Final    Comment: (NOTE) The Xpert SA Assay (FDA approved for NASAL specimens in  patients 28 years of age and older), is one component of a comprehensive surveillance program. It is not intended to diagnose infection nor to guide or monitor treatment. Performed at [facility], [place] [address], [place], [place] [address]       Radiology Studies: US BIOPSY (KIDNEY)  Result Date: 10/18/2020 INDICATION: large left renal mass consistent with renal cell carcinoma, evidence of metastatic disease by imaging EXAM: ULTRASOUND GUIDED CORE BIOPSY OF LEFT RENAL MASS MEDICATIONS: 1% LIDOCAINE LOCAL ANESTHESIA/SEDATION: Versed 2.0mg  IV; Fentanyl 123mcg IV; Moderate Sedation Time:  10 minutes The patient was continuously monitored during the procedure by the interventional radiology nurse under my direct supervision. FLUOROSCOPY TIME:  Fluoroscopy Time: None. COMPLICATIONS: None immediate. PROCEDURE: The procedure, risks, benefits, and alternatives were explained to the patient. Questions regarding the procedure were encouraged and  answered. The patient understands and consents to the procedure. Previous imaging reviewed. Patient positioned prone. Preliminary ultrasound performed. The large left kidney exophytic mass was localized and marked. This was correlated with the recent CT. Under sterile conditions and local anesthesia, a 17 gauge coaxial guide needle was advanced from a posterior approach to the lesion. Needle position confirmed with ultrasound. Images obtained for documentation. 18 gauge core biopsies obtained. Samples were intact and placed in formalin. Needle tract occluded with Gel-Foam. Postprocedure imaging demonstrates no hemorrhage or hematoma. Patient tolerated biopsy well. FINDINGS: Imaging confirms needle placement in the left renal mass for core biopsy IMPRESSION: Successful ultrasound left renal mass 18 gauge core biopsy Electronically Signed   By: Jerilynn Mages.  Shick M.D.   On: 10/18/2020 15:19     Scheduled Meds: . dexamethasone  4 mg Oral Q8H  . levETIRAcetam  500 mg Oral  BID  . sodium chloride flush  3 mL Intravenous Q12H   Continuous Infusions: . vancomycin       LOS: 5 days   Time Spent in minutes   45 minutes  Keena Dinse D.O. on 10/20/2020 at 8:46 AM  Between 7am to 7pm - Please see pager noted on [email or web address]  After 7pm go to [email or web address]  And look for the night coverage person covering for me after hours  [facility] Office  [phone]

## 2020-10-20 NOTE — Anesthesia Procedure Notes (Signed)
Arterial Line Insertion Start/End5/27/2022 12:36 PM, 10/20/2020 12:36 PM Performed by: Kathryne Hitch, CRNA, CRNA  Patient location: Pre-op. Preanesthetic checklist: patient identified, IV checked, site marked, risks and benefits discussed, surgical consent, monitors and equipment checked, pre-op evaluation, timeout performed and anesthesia consent Lidocaine 1% used for infiltration Left, radial was placed Catheter size: 20 Fr Hand hygiene performed  and maximum sterile barriers used   Attempts: 1 Procedure performed without using ultrasound guided technique. Following insertion, dressing applied and Biopatch. Post procedure assessment: normal and unchanged  Patient tolerated the procedure well with no immediate complications. Additional procedure comments: Dorene Grebe, SRNA placed left radial aline. Marland Kitchen

## 2020-10-20 NOTE — Anesthesia Postprocedure Evaluation (Signed)
Anesthesia Post Note  Patient: Kenneth Grant  Procedure(s) Performed: Right sided craniotomy for tumor resection (Right Head) APPLICATION OF CRANIAL NAVIGATION (N/A )     Patient location during evaluation: PACU Anesthesia Type: General Level of consciousness: awake and alert, patient cooperative and oriented Pain management: pain level controlled Vital Signs Assessment: post-procedure vital signs reviewed and stable Respiratory status: spontaneous breathing, nonlabored ventilation and respiratory function stable Cardiovascular status: blood pressure returned to baseline and stable Postop Assessment: no apparent nausea or vomiting Anesthetic complications: no   No complications documented.  Last Vitals:  Vitals:   10/20/20 1540 10/20/20 1550  BP: 115/68 106/60  Pulse:  (!) 54  Resp:  (!) 8  Temp:    SpO2:  94%    Last Pain:  Vitals:   10/20/20 1535  TempSrc:   PainSc: Asleep                 Roniyah Llorens,E. Naasir Carreira

## 2020-10-20 NOTE — Consult Note (Signed)
Neurosurgery Consultation  Reason for Consult: Brain tumor Referring Physician: Ree Kida  CC: Seizure  HPI: This is a 60 y.o. man that presented with a likely seizure, no h/o epilepsy prior to this, he does endorse some dysarthria. He is right handed, always has been right handed, does not use his left preferentially for everything. He also endorses some clumsiness of the left hand, no other complaints. 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    ROS: A 14 point ROS was performed and is negative except as noted in the HPI.   PMHx:  Past Medical History:  Diagnosis Date  . Cancer ([place])    FamHx: History reviewed. No pertinent family history. SocHx:  has no history on file for tobacco use, alcohol use, and drug use.  Exam: Vital signs in last 24 hours: Temp:  [97.4 F (36.3 C)-98.4 F (36.9 C)] 98 F (36.7 C) (05/27 0341) Pulse Rate:  [56-86] 65 (05/27 0341) Resp:  [16-18] 18 (05/27 0341) BP: (107-154)/(58-75) 107/60 (05/27 0341) SpO2:  [95 %-97 %] 95 % (05/27 0341) General: Awake, alert, cooperative, lying in bed in NAD Head: Normocephalic and atruamatic HEENT: Neck supple Pulmonary: breathing room air comfortably, no evidence of increased work of breathing Cardiac: RRR Abdomen: S NT ND Extremities: Warm and well perfused x4 Neuro: AOx3, PERRL, EOMI, +UMN left facial palsy w/ NLF Strength 5/5 in RUE / BLE, 4/5 in distal LUE, SILTx4, +LUE drift   Assessment and Plan: 60 y.o. 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 MRI brain personally reviewed, which shows right frontal enhancing mass with impressive surrounding edema s/p preop SRS.  -OR for resection today -4N ICU post-op   Judith Part, MD 10/20/20 7:44 AM [facility]

## 2020-10-20 NOTE — Brief Op Note (Signed)
10/20/2020  2:15 PM  PATIENT:  [name]  60 y.o. male  PRE-OPERATIVE DIAGNOSIS:  Brain tumor  POST-OPERATIVE DIAGNOSIS:  Brain tumor  PROCEDURE:  Procedure(s): Right sided craniotomy for tumor resection (Right) APPLICATION OF CRANIAL NAVIGATION (N/A)  SURGEON:  Surgeon(s) and Role:    * Niel Peretti, Joyice Faster, MD - Primary  PHYSICIAN ASSISTANT:   ANESTHESIA:   general  EBL:  200 mL   BLOOD ADMINISTERED:none  DRAINS: none   LOCAL MEDICATIONS USED:  LIDOCAINE   SPECIMEN:  Source of Specimen:  Right brain tumor  DISPOSITION OF SPECIMEN:  PATHOLOGY  COUNTS:  YES  TOURNIQUET:  * No tourniquets in log *  DICTATION: .Note written in EPIC  PLAN OF CARE: Admit to inpatient   PATIENT DISPOSITION:  PACU - hemodynamically stable.   Delay start of Pharmacological VTE agent (>24hrs) due to surgical blood loss or risk of bleeding: yes

## 2020-10-20 NOTE — Transfer of Care (Signed)
Immediate Anesthesia Transfer of Care Note  Patient: Kenneth Grant  Procedure(s) Performed: Right sided craniotomy for tumor resection (Right Head) APPLICATION OF CRANIAL NAVIGATION (N/A )  Patient Location: PACU  Anesthesia Type:General  Level of Consciousness: drowsy and patient cooperative  Airway & Oxygen Therapy: Patient Spontanous Breathing and Patient connected to face mask oxygen  Post-op Assessment: Report given to RN and Post -op Vital signs reviewed and stable  Post vital signs: Reviewed and stable  Last Vitals:  Vitals Value Taken Time  BP 124/76 10/20/20 1421  Temp    Pulse 76 10/20/20 1424  Resp 15 10/20/20 1424  SpO2 96 % 10/20/20 1424  Vitals shown include unvalidated device data.  Last Pain:  Vitals:   10/20/20 1123  TempSrc:   PainSc: 0-No pain         Complications: No complications documented.

## 2020-10-20 NOTE — Anesthesia Procedure Notes (Signed)
Procedure Name: Intubation Date/Time: 10/20/2020 12:35 PM Performed by: Kathryne Hitch, CRNA Pre-anesthesia Checklist: Patient identified, Emergency Drugs available, Suction available and Patient being monitored Patient Re-evaluated:Patient Re-evaluated prior to induction Oxygen Delivery Method: Circle system utilized Preoxygenation: Pre-oxygenation with 100% oxygen Induction Type: IV induction Ventilation: Mask ventilation without difficulty Laryngoscope Size: Mac and 4 Grade View: Grade I Tube type: Oral Tube size: 7.5 mm Number of attempts: 1 Airway Equipment and Method: Stylet and Oral airway Placement Confirmation: ETT inserted through vocal cords under direct vision,  positive ETCO2 and breath sounds checked- equal and bilateral Secured at: 22 cm Tube secured with: Tape Dental Injury: Teeth and Oropharynx as per pre-operative assessment

## 2020-10-20 NOTE — Anesthesia Preprocedure Evaluation (Addendum)
Anesthesia Evaluation  Patient identified by MRN, date of birth, ID band Patient awake    Reviewed: Allergy & Precautions, NPO status , Patient's Chart, lab work & pertinent test results  History of Anesthesia Complications Negative for: history of anesthetic complications  Airway Mallampati: II  TM Distance: >3 FB Neck ROM: Full    Dental  (+) Caps, Dental Advisory Given, Poor Dentition   Pulmonary former smoker,    breath sounds clear to auscultation       Cardiovascular negative cardio ROS   Rhythm:Regular Rate:Normal     Neuro/Psych Syncope: brain mets    GI/Hepatic negative GI ROS, Neg liver ROS,   Endo/Other  negative endocrine ROS  Renal/GU Renal cell carcinoma     Musculoskeletal   Abdominal   Peds  Hematology negative hematology ROS (+)   Anesthesia Other Findings   Reproductive/Obstetrics                            Anesthesia Physical Anesthesia Plan  ASA: III  Anesthesia Plan: General   Post-op Pain Management:    Induction: Intravenous  PONV Risk Score and Plan: 2 and Ondansetron and Dexamethasone  Airway Management Planned: Oral ETT  Additional Equipment: Arterial line  Intra-op Plan:   Post-operative Plan: Extubation in OR  Informed Consent: I have reviewed the patients History and Physical, chart, labs and discussed the procedure including the risks, benefits and alternatives for the proposed anesthesia with the patient or authorized representative who has indicated his/her understanding and acceptance.     Dental advisory given  Plan Discussed with: CRNA and Surgeon  Anesthesia Plan Comments:        Anesthesia Quick Evaluation

## 2020-10-21 ENCOUNTER — Inpatient Hospital Stay (HOSPITAL_COMMUNITY)

## 2020-10-21 DIAGNOSIS — D496 Neoplasm of unspecified behavior of brain: Secondary | ICD-10-CM

## 2020-10-21 DIAGNOSIS — G9389 Other specified disorders of brain: Secondary | ICD-10-CM | POA: Diagnosis not present

## 2020-10-21 DIAGNOSIS — C799 Secondary malignant neoplasm of unspecified site: Secondary | ICD-10-CM | POA: Diagnosis not present

## 2020-10-21 DIAGNOSIS — N2889 Other specified disorders of kidney and ureter: Secondary | ICD-10-CM | POA: Diagnosis not present

## 2020-10-21 LAB — RENAL FUNCTION PANEL
Albumin: 3 g/dL — ABNORMAL LOW (ref 3.5–5.0)
Anion gap: 8 (ref 5–15)
BUN: 24 mg/dL — ABNORMAL HIGH (ref 8–23)
CO2: 24 mmol/L (ref 22–32)
Calcium: 8.6 mg/dL — ABNORMAL LOW (ref 8.9–10.3)
Chloride: 103 mmol/L (ref 98–111)
Creatinine, Ser: 0.91 mg/dL (ref 0.61–1.24)
GFR, Estimated: >60 mL/min (ref >60)
Glucose, Bld: 151 mg/dL — ABNORMAL HIGH (ref 70–99)
Phosphorus: 3.5 mg/dL (ref 2.5–4.6)
Potassium: 4.5 mmol/L (ref 3.5–5.1)
Sodium: 135 mmol/L (ref 135–145)

## 2020-10-21 IMAGING — MR MR HEAD WO/W CM
7 of 14 series · 22 of 48 positions shown · IV contrast (Yes GAD)
Comparison: MRI [DATE]

CLINICAL DATA: Brain mass or lesion status post craniotomy for
tumor resection.

EXAM:
MRI HEAD WITHOUT AND WITH CONTRAST
TECHNIQUE: Multiplanar, multiecho pulse sequences of the brain and surrounding
structures were obtained without and with intravenous contrast.
CONTRAST:  8.5mL GADAVIST GADOBUTROL 1 MMOL/ML IV SOLN

[Series 2: DWI · axial · 3.0mm · 0.94mm/px · z∈[-53,+102]mm · 6 of 106 slices shown (1 of 2)]
[im 1/106]
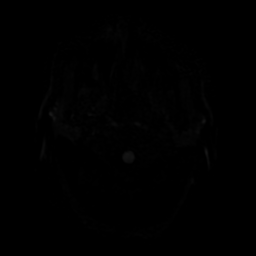
[im 22/106]
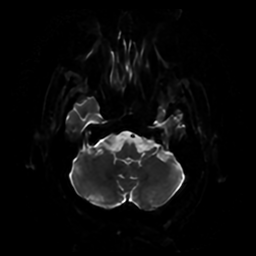
[im 43/106]
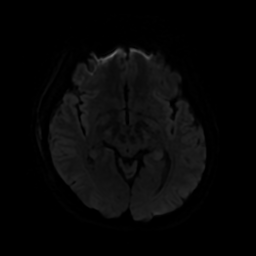
[im 64/106]
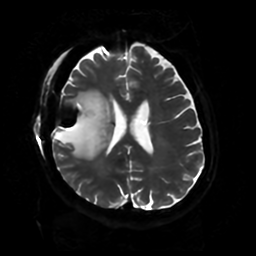
[im 85/106]
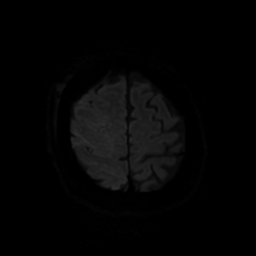
[im 106/106]
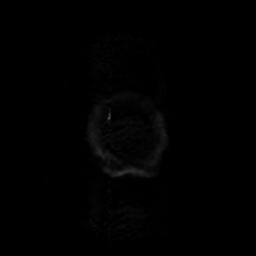

[Series 3: DWI · coronal · 4.0mm · 0.94mm/px · 5 of 72 slices shown (2 of 2)]
[im 1/72]
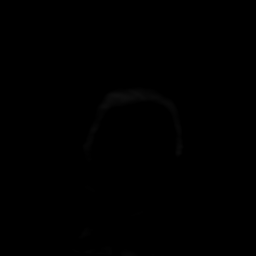
[im 18/72]
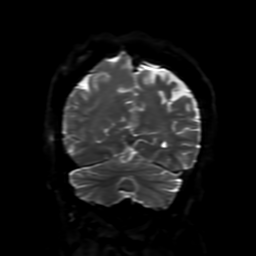
[im 36/72]
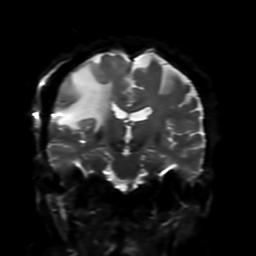
[im 54/72]
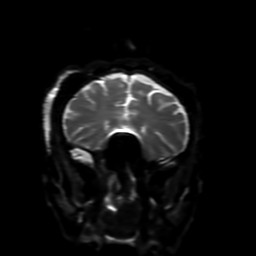
[im 72/72]
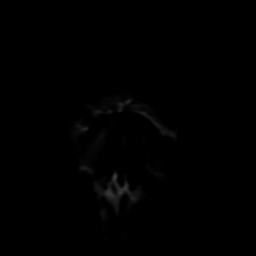

[Series 4: FLAIR · sagittal · 5.0mm · 0.23mm/px · 2 of 27 slices shown (1 of 2)]
[im 1/27]
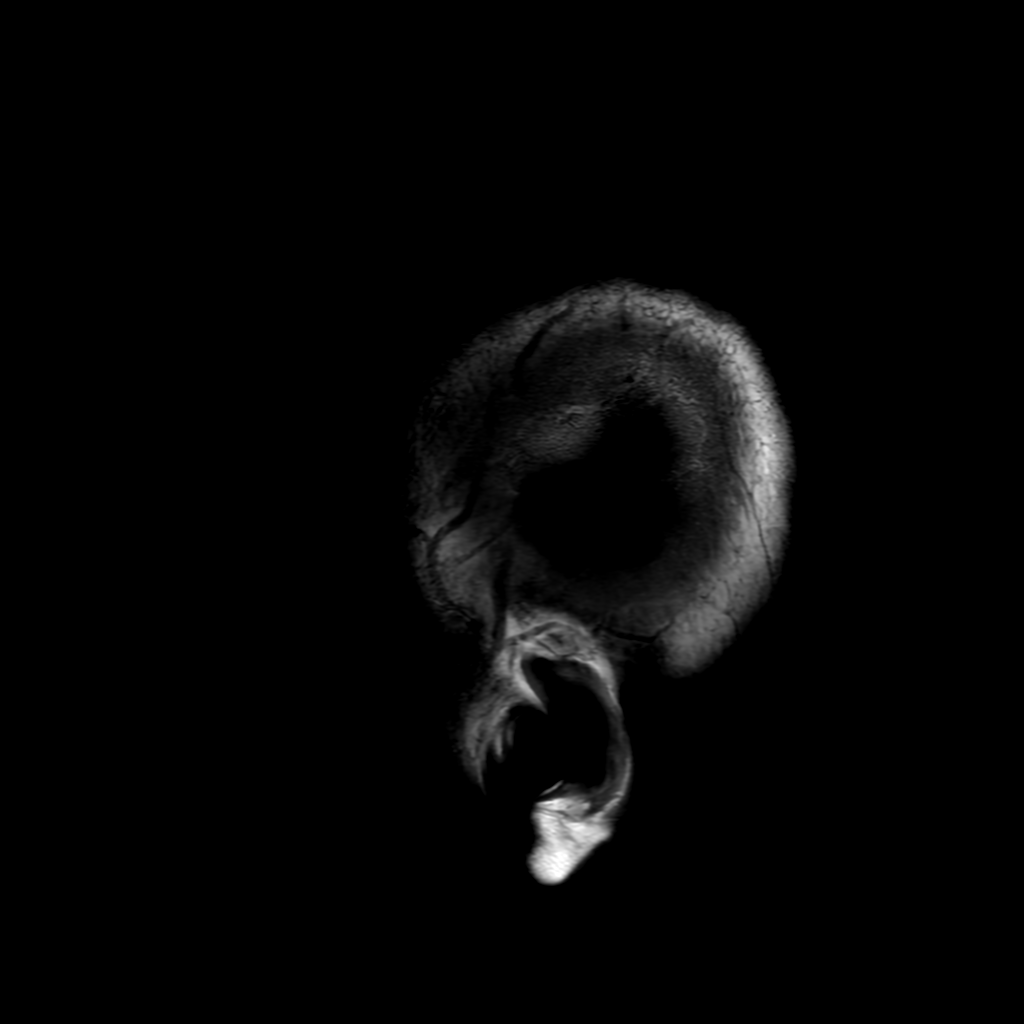
[im 27/27]
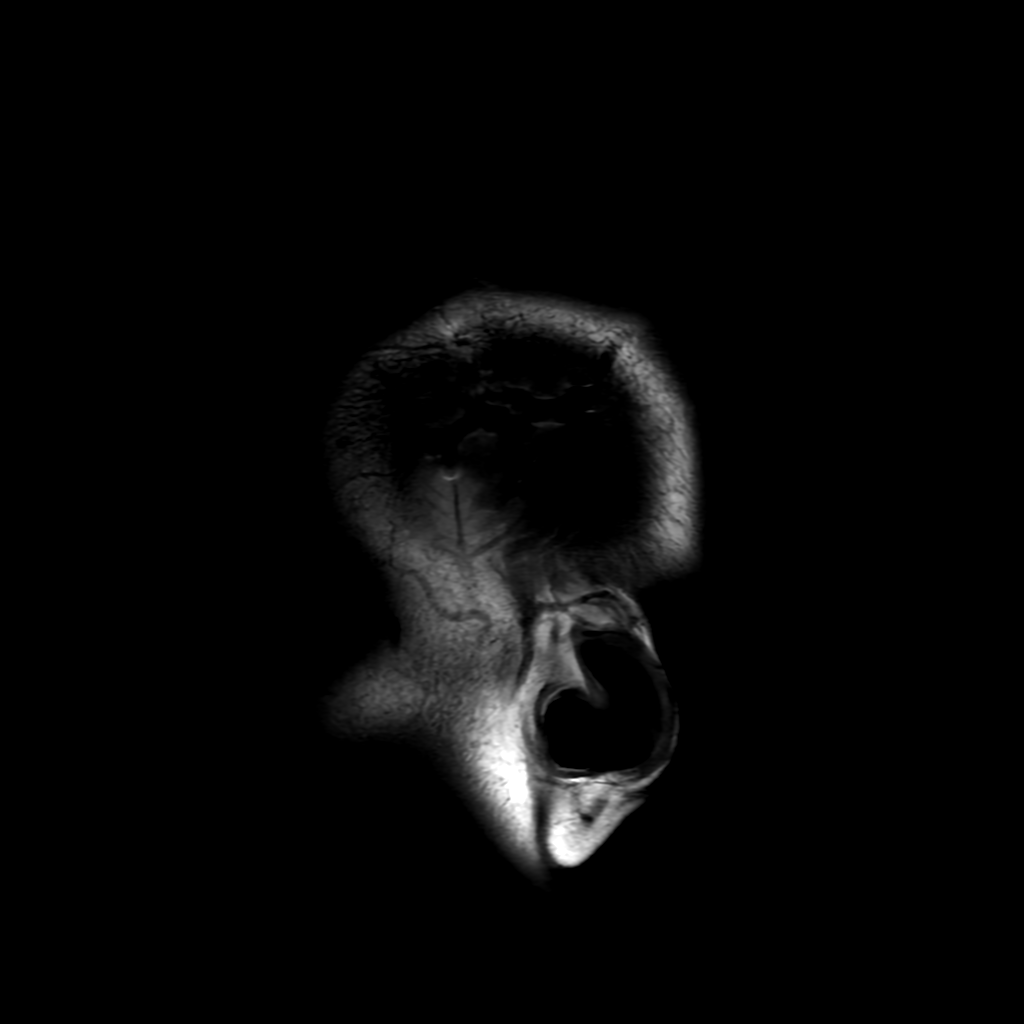

[Series 6: FLAIR · axial · 3.0mm · 0.45mm/px · z∈[-54,+102]mm · 2 of 27 slices shown (2 of 2)]
[im 1/27]
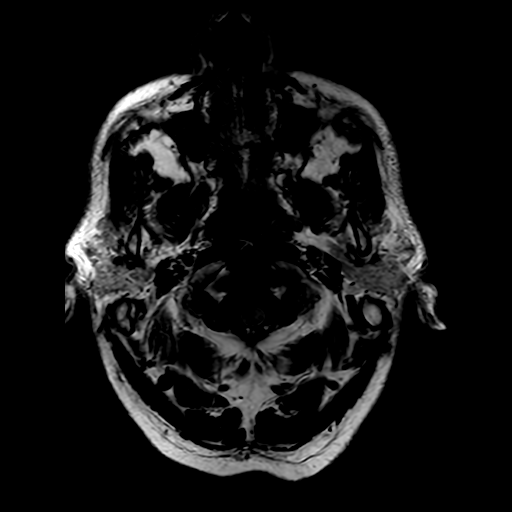
[im 27/27]
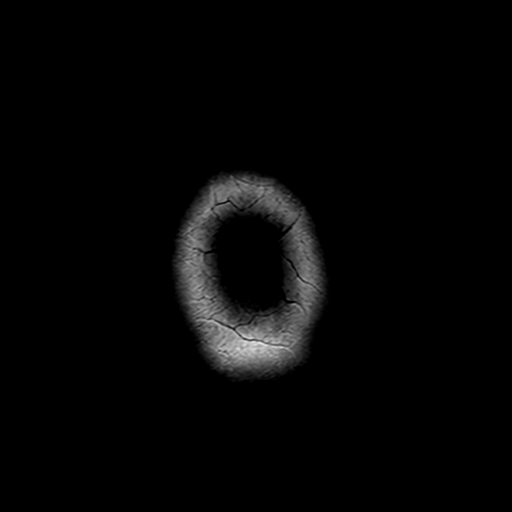

[Series 12: FLAIR post-contrast · sagittal · 5.0mm · 0.47mm/px · 2 of 27 slices shown]
[im 1/27]
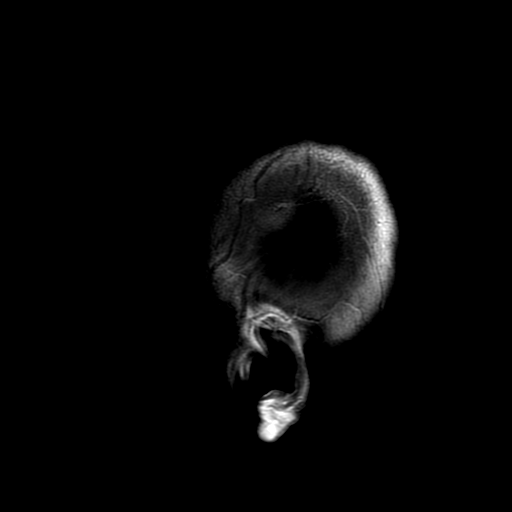
[im 27/27]
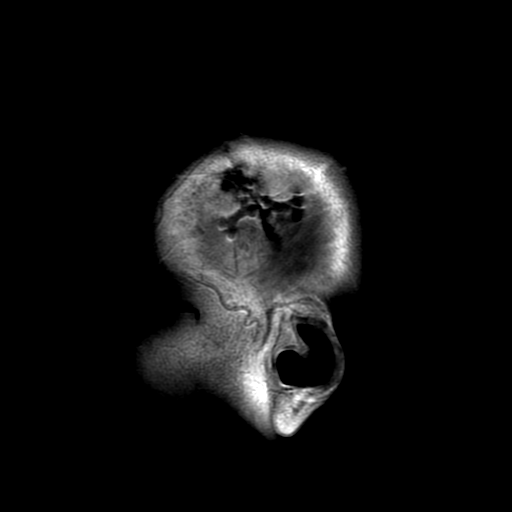

[Series 250: ADC · axial · 3.0mm · 0.94mm/px · z∈[-53,+102]mm · 3 of 53 slices shown (1 of 2)]
[im 1/53]
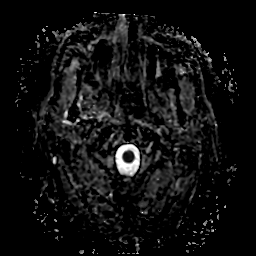
[im 27/53]
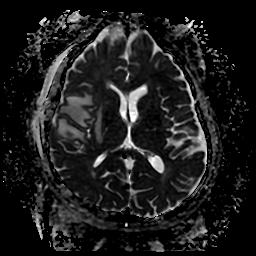
[im 53/53]
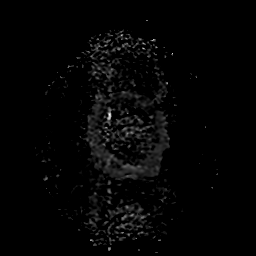

[Series 350: ADC · coronal · 4.0mm · 0.94mm/px · 2 of 36 slices shown (2 of 2)]
[im 1/36]
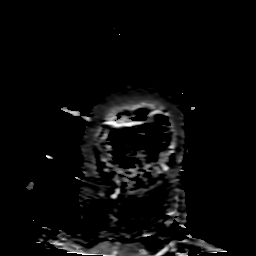
[im 36/36]
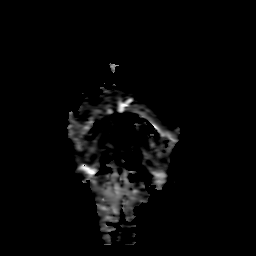

[22 of 48 positions shown; findings below may reference images not displayed]

FINDINGS: Brain: Interval right frontal craniotomy and resection of the
previously seen right frontal enhancing lesion. There is expected
postsurgical changes including mild/linear enhancement and
restricted diffusion about the periphery of the resection cavity and
blood products/susceptibility artifact throughout the resection
cavity. No nodular/masslike enhancement to suggest residual tumor.
There is exuberant surrounding vasogenic edema which is similar to
prior. Similar mass effect with mild effacement of the right lateral
ventricle. No significant midline shift. No new enhancing lesions
identified. No hydrocephalus.

Punctate nonenhancing areas of restricted diffusion in the
represent tiny acute infarcts. No significant associated edema or
mass effect.

Vascular: Major arterial flow voids are maintained at the skull
base.

Skull and upper cervical spine: Right frontal craniotomy. Otherwise,
normal marrow signal.

Sinuses/Orbits: Mild ethmoid air cell mucosal thickening without
air-fluid levels. Unremarkable orbits.

Other: No sizable mastoid effusions.
IMPRESSION: 1. New punctate nonenhancing areas of restricted diffusion in the
periventricular right parietal lobe likely represent tiny acute
infarcts. No significant associated edema or mass effect. Recommend
attention on follow-up.
2. Interval right frontal craniotomy and resection of a right
frontal metastasis with expected postsurgical findings at the
resection site and no nodular enhancement to suggest residual tumor.
Similar exuberant surrounding edema and mass effect.

## 2020-10-21 MED ORDER — GADOBUTROL 1 MMOL/ML IV SOLN
8.5000 mL | Freq: Once | INTRAVENOUS | Status: AC | PRN
  Administered 2020-10-21: 8.5 mL via INTRAVENOUS

## 2020-10-21 NOTE — Evaluation (Signed)
Occupational Therapy Evaluation Patient Details Name: Kenneth Grant MRN: [id number] DOB: Sep 09, 1960 Today's Date: 10/21/2020    History of Present Illness Pt is a 60 y.o. male who presented 5/22 with an episode of dizziness and syncope resulting in him hitting his head. Pt also with L hand weakness, headache, decreased hearing, and slurred speech. Imaging revealed a R frontal lobe mass with vasogenic edema and mass effect concerning for malignancy and diffuse metastatic disease. Microhemorrhage within the mass with regional mass effect but no midline shift. CT chest/abd/pelvis: Large left kidney mass consistent with RCC and possible small solid mass in the lower pole of the left kidney which is suspicious for a second malignancy. Small solid mass in the lower pole of the right kidney. Numerous bilateral pulmonary nodules and Bilateral adrenal nodules consistent with metastatic disease. Small right inferior hepatic lobe lesion. S/p [place] L renal mass core bx 5/25. S/p R crani for tumor resection 5/27. No known past medical hx.   Clinical Impression   Pt admitted with above. He demonstrates the below listed deficits and will benefit from continued OT to maximize safety and independence with BADLs.  Pt presents to OT with impulsivity, impaired attention, impaired memory, deficits with problem solving, and poor awareness.  He currently is able to perform ADLs with supervision - mod I, but requires direct supervision for safety.  He demonstrates poor awareness to self monitor or self correct.  He was unable to complete a path finding task, and was unable to recognize errors or problem solve through solutions.  He reports he plans to return home to his girlfriend's home.  Per CM note, he will have support at discharge.  Recommend NO driving, 24 hour supervision, direct supervision with cooking tasks, medication and financial management, and no operation/use of sharp or power tools.  Also recommend OPOT, and OP SLP  for cognition.  Will follow acutely.       Follow Up Recommendations  Outpatient OT;Other (comment) (and OPSLP)    Equipment Recommendations  None recommended by OT    Recommendations for Other Services       Precautions / Restrictions Precautions Precautions: Fall Precaution Comments: very distractible and impulsive with poor awareness Restrictions Weight Bearing Restrictions: No      Mobility Bed Mobility Overal bed mobility: Independent             General bed mobility comments: Pt quick to perform bed mobility without difficulty.    Transfers Overall transfer level: Needs assistance Equipment used: None Transfers: Sit to/from [facility] Sit to Stand: Supervision Stand pivot transfers: Min guard       General transfer comment: pt requires supervision due to being highly impulsive with poor safety awareness    Balance Overall balance assessment: No apparent balance deficits (not formally assessed)                               Standardized Balance Assessment Standardized Balance Assessment : Dynamic Gait Index   Dynamic Gait Index Level Surface: Normal Change in Gait Speed: Normal Gait with Horizontal Head Turns: Normal Gait with Vertical Head Turns: Normal Gait and Pivot Turn: Normal Step Over Obstacle: Normal Step Around Obstacles: Normal Steps: Normal Total Score: 24     ADL either performed or assessed with clinical judgement   ADL Overall ADL's : Needs assistance/impaired Eating/Feeding: Modified independent;Sitting   Grooming: Wash/dry hands;Oral care;Wash/dry face;Brushing hair;Set up;Standing   Upper Body Bathing:  Supervision/ safety   Lower Body Bathing: Supervison/ safety;Sit to/from stand   Upper Body Dressing : Supervision/safety;Sitting   Lower Body Dressing: Supervision/safety;Sit to/from stand   Toilet Transfer: Supervision/safety;Ambulation;Comfort height toilet   Toileting- Clothing  Manipulation and Hygiene: Supervision/safety;Sit to/from stand       Functional mobility during ADLs: Supervision/safety General ADL Comments: Pt requires supervision due to attentional deficits and impulsivity     Vision Baseline Vision/History: Wears glasses Wears Glasses: At all times Patient Visual Report: No change from baseline Additional Comments: vision to be further assessed.  Pt able to read clock on wall. He, however, does not efficiently or effectively scan his environment to pick up landmarks or visual cues.  Ancitipate visual scanning deficits vs. visual attention deficit or a combo of both     Perception Perception Perception Tested?: Yes   Praxis Praxis Praxis tested?: Within functional limits    Pertinent Vitals/Pain Pain Assessment: 0-10 Pain Score: 7  Pain Descriptors / Indicators: Headache;Pressure;Constant Pain Intervention(s): Monitored during session     Hand Dominance Right   Extremity/Trunk Assessment Upper Extremity Assessment Upper Extremity Assessment: LUE deficits/detail LUE Deficits / Details: Pt reports old injury to wrist with tendon injury resulting in impaired wrist extension and impaired ability to fully extend fingers.  He reports this Left UE is at baseline   Lower Extremity Assessment Lower Extremity Assessment: Overall WFL for tasks assessed   Cervical / Trunk Assessment Cervical / Trunk Assessment: Kyphotic   Communication Communication Communication: No difficulties   Cognition Arousal/Alertness: Awake/alert Behavior During Therapy: Impulsive Overall Cognitive Status: Impaired/Different from baseline Area of Impairment: Attention;Memory;Following commands;Safety/judgement;Awareness;Problem solving                   Current Attention Level: Selective Memory: Decreased short-term memory Following Commands: Follows multi-step commands inconsistently Safety/Judgement: Decreased awareness of safety;Decreased awareness of  deficits Awareness: Emergent Problem Solving: Difficulty sequencing General Comments: Pt demonstrates poor awareness of deficits.  He is very impulsive.  He is hyper verbal and is unable to monitor or alter rate of speech.  He is able to selectively attend to info at beginning of session, but as the demands of the task increased, he demonstrated increasing difficulty, and requires max  cues to alternate and divide his attention.  He initially was able to follow a 4 step command, to perform a path finding task, and scored 6/29 on the Short Blessed Test (while ambulating) demonstrating deficits with attention and memory.  He however, was unable to complete a path finding task to find his way back to his room, and was unable to use visual cues such as room numbers to problem solve how to find his room, and instead talked non stop, creating self distraction, and wondered around the hospital unaware, and was unable to back track despite max cues.  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   As he fatigued, he demonstrated increased difficulty retaining info.   Deficits discussed with pt.  He initially was inisistent he has no deficits, but with further conversation and discussion, he did acknowledge deficits, but then his awareness and ability to carry the info over to the next task waned.   General Comments  VSS.  Spoke with NP re: pt need for 24 hour supervision as well as OPOT and SLP    Exercises     Shoulder Instructions      Home Living Family/patient expects to  be discharged to:: Private residence Living Arrangements: Spouse/significant other (girlfriend) Available Help at Discharge: Family;Available 24 hours/day Type of Home: House Home Access: Stairs to enter [employer] of Steps: 5-6 Entrance Stairs-Rails: None Home Layout: One level     Bathroom Shower/Tub: Insurance claims handler: Standard     Home  Equipment: Grab bars - tub/shower;Wheelchair - [employer] - standard          Prior Functioning/Environment Level of Independence: Independent        Comments: Pt owns a [place]. Hx of L hand tendon injury impacting function.        OT Problem List: Impaired vision/perception;Decreased cognition;Decreased safety awareness;Pain      OT Treatment/Interventions: Self-care/ADL training;Therapeutic activities;Cognitive remediation/compensation;Visual/perceptual remediation/compensation;Patient/family education;Balance training    OT Goals(Current goals can be found in the care plan section) Acute Rehab OT Goals Patient Stated Goal: to go home today OT Goal Formulation: With patient Time For Goal Achievement: 11/03/20 Potential to Achieve Goals: Good  OT Frequency: Min 2X/week   Barriers to D/C:            Co-evaluation PT/OT/SLP Co-Evaluation/Treatment: Yes Reason for Co-Treatment: For patient/therapist safety;Necessary to address cognition/behavior during functional activity;To address functional/ADL transfers PT goals addressed during session: Mobility/safety with mobility;Balance OT goals addressed during session: ADL's and self-care      AM-PAC OT "6 Clicks" Daily Activity     Outcome Measure Help from another person eating meals?: None Help from another person taking care of personal grooming?: A Little Help from another person toileting, which includes using toliet, bedpan, or urinal?: A Little Help from another person bathing (including washing, rinsing, drying)?: A Little Help from another person to put on and taking off regular upper body clothing?: A Little Help from another person to put on and taking off regular lower body clothing?: A Little 6 Click Score: 19   End of Session Nurse Communication: Mobility status  Activity Tolerance: Patient tolerated treatment well Patient left: Other (comment) (with RN going for MRI)  OT Visit Diagnosis:  Cognitive communication deficit (R41.841) Symptoms and signs involving cognitive functions:  (brain metastases)                Time: 9741-6384 OT Time Calculation (min): 43 min Charges:  OT General Charges $OT Visit: 1 Visit OT Evaluation $OT Eval Moderate Complexity: 1 Mod  Nilsa Nutting., OTR/L Acute Rehabilitation Services Pager [phone] Office [phone]   Lucille Passy M 10/21/2020, 10:38 AM

## 2020-10-21 NOTE — Evaluation (Signed)
Physical Therapy Evaluation & Discharge Patient Details Name: Kenneth Grant MRN: [id number] DOB: [birth year] Today's Date: 10/21/2020   History of Present Illness  Pt is a 60 y.o. male who presented 5/22 with an episode of dizziness and syncope resulting in him hitting his head. Pt also with L hand weakness, headache, decreased hearing, and slurred speech. Imaging revealed a R frontal lobe mass with vasogenic edema and mass effect concerning for malignancy and diffuse metastatic disease. Microhemorrhage within the mass with regional mass effect but no midline shift. CT chest/abd/pelvis: Large left kidney mass consistent with RCC and possible small solid mass in the lower pole of the left kidney which is suspicious for a second malignancy. Small solid mass in the lower pole of the right kidney. Numerous bilateral pulmonary nodules and Bilateral adrenal nodules consistent with metastatic disease. Small right inferior hepatic lobe lesion. S/p [place] L renal mass core bx 5/25. S/p R crani for tumor resection 5/27. No known past medical hx.    Clinical Impression  Pt presents with condition above. PTA, he was independent with all functional mobility and ADLs and plans to go home with his girlfriend to a 1-level house with 5-6 STE. Pt owns a [place]. Currently, pt demonstrates [facility] strength, coordination, and sensation in his lower extremities and WFL gait and balance, supported by his DGI score of 24 this date. However, he displays significant deficits in his attention span, impulsivity, memory, and judgement into his safety and deficits, see Cognition below. He will need 24/7 supervision at d/c to ensure safety. Pt at baseline functionally but not cognitively, no further PT services needed thus PT will sign off. All education completed and questions answered. Recommend follow-up with Outpatient SLP.   Follow Up Recommendations No PT follow up;Supervision/Assistance - 24 hour    Equipment  Recommendations  None recommended by PT    Recommendations for Other Services Speech consult (Outpatient)     Precautions / Restrictions Precautions Precautions: Fall Precaution Comments: very distractible and impulsive with poor awareness Restrictions Weight Bearing Restrictions: No      Mobility  Bed Mobility Overal bed mobility: Independent             General bed mobility comments: Pt quick to perform bed mobility without difficulty.    Transfers Overall transfer level: Needs assistance Equipment used: None Transfers: Sit to/from Stand Sit to Stand: Supervision         General transfer comment: Pt able to come to stand quickly without LOB, but pt impulsive so supervision for safety.  Ambulation/Gait Ambulation/Gait assistance: Min guard;Supervision Gait Distance (Feet): 1600 Feet Assistive device: Rolling walker (2 wheeled) Gait Pattern/deviations: WFL(Within Functional Limits);Narrow base of support Gait velocity: WNL Gait velocity interpretation: >4.37 ft/sec, indicative of normal walking speed General Gait Details: Pt with narrow stance but overall WFL and failry symmetrical gait. Min guard-supervision for safety and line management due to impulsivity.  Stairs Stairs: Yes Stairs assistance: Min guard Stair Management: One rail Left;Alternating pattern;Forwards;No rails Number of Stairs: 7 General stair comments: Demonstrated sequence to ascend only 3 stairs then turn around holding onto rail and then descend due to lines limiting amount of stairs he could go up. Pt forgetful and impulsive despite these cues and continuing to go to 4th step and needing cues to pause, pay attention to direction, and turn to descend. Intermittent L rail use when descending but overall able to negotiate stairs with min guard for safety and no overt LOB.  Wheelchair Mobility  Modified Rankin (Stroke Patients Only) Modified Rankin (Stroke Patients Only) Pre-Morbid Rankin  Score: No symptoms Modified Rankin: Moderate disability     Balance Overall balance assessment: No apparent balance deficits (not formally assessed)                               Standardized Balance Assessment Standardized Balance Assessment : Dynamic Gait Index   Dynamic Gait Index Level Surface: Normal Change in Gait Speed: Normal Gait with Horizontal Head Turns: Normal Gait with Vertical Head Turns: Normal Gait and Pivot Turn: Normal Step Over Obstacle: Normal Step Around Obstacles: Normal Steps: Normal Total Score: 24       Pertinent Vitals/Pain Pain Assessment: 0-10 Pain Score: 7  Pain Descriptors / Indicators: Headache;Pressure;Constant Pain Intervention(s): Monitored during session    Home Living Family/patient expects to be discharged to:: Private residence Living Arrangements: Spouse/significant other (girlfriend) Available Help at Discharge: Family;Available 24 hours/day Type of Home: House Home Access: Stairs to enter Entrance Stairs-Rails: None Entrance Stairs-Number of Steps: 5-6 Home Layout: One level Home Equipment: Grab bars - tub/shower;Wheelchair - [employer] - standard      Prior Function Level of Independence: Independent         Comments: Pt owns a [place]. Hx of L hand tendon injury impacting function.     Hand Dominance   Dominant Hand: Right    Extremity/Trunk Assessment   Upper Extremity Assessment Upper Extremity Assessment: LUE deficits/detail LUE Deficits / Details: Pt reports old injury to wrist with tendon injury resulting in impaired wrist extension and impaired ability to fully extend fingers.  He reports this Left UE is at baseline    Lower Extremity Assessment Lower Extremity Assessment: Overall WFL for tasks assessed    Cervical / Trunk Assessment Cervical / Trunk Assessment: Kyphotic  Communication   Communication: No difficulties  Cognition Arousal/Alertness: Awake/alert Behavior  During Therapy: Impulsive Overall Cognitive Status: Impaired/Different from baseline Area of Impairment: Attention;Memory;Following commands;Safety/judgement;Awareness;Problem solving                   Current Attention Level: Selective Memory: Decreased short-term memory Following Commands: Follows multi-step commands inconsistently Safety/Judgement: Decreased awareness of safety;Decreased awareness of deficits Awareness: Emergent Problem Solving: Difficulty sequencing General Comments: Pt demonstrates poor awareness of deficits.  He is very impulsive.  He is hyper verbal and is unable to monitor or alter rate of speech.  He is able to selectively attend to info at beginning of session, but as the demands of the task increased, he demonstrated increasing difficulty, and requires max  cues to alternate and divide his attention.  He initially was able to follow a 4 step command, to perform a path finding task, and scored 6/29 on the Short Blessed Test (while ambulating) demonstrating deficits with attention and memory.  He however, was unable to complete a path finding task to find his way back to his room, and was unable to use visual cues such as room numbers to problem solve how to find his room, and instead talked non stop, creating self distraction, and wondered around the hospital unaware, and was unable to back track despite max cues.  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   As he fatigued, he demonstrated increased difficulty retaining info.   Deficits discussed with pt.  He initially was inisistent he has no deficits, but with further  conversation and discussion, he did acknowledge deficits, but then his awareness and ability to carry the info over to the next task waned.      General Comments      Exercises     Assessment/Plan    PT Assessment Patent does not need any further PT services  PT Problem List         PT  Treatment Interventions      PT Goals (Current goals can be found in the Care Plan section)  Acute Rehab PT Goals Patient Stated Goal: to go home PT Goal Formulation: With patient Time For Goal Achievement: 10/22/20 Potential to Achieve Goals: Good    Frequency     Barriers to discharge        Co-evaluation PT/OT/SLP Co-Evaluation/Treatment: Yes Reason for Co-Treatment: For patient/therapist safety;Necessary to address cognition/behavior during functional activity;To address functional/ADL transfers PT goals addressed during session: Mobility/safety with mobility;Balance OT goals addressed during session: ADL's and self-care       AM-PAC PT "6 Clicks" Mobility  Outcome Measure Help needed turning from your back to your side while in a flat bed without using bedrails?: None Help needed moving from lying on your back to sitting on the side of a flat bed without using bedrails?: None Help needed moving to and from a bed to a chair (including a wheelchair)?: A Little Help needed standing up from a chair using your arms (e.g., wheelchair or bedside chair)?: A Little Help needed to walk in hospital room?: A Little Help needed climbing 3-5 steps with a railing? : A Little 6 Click Score: 20    End of Session   Activity Tolerance: Patient tolerated treatment well Patient left: in chair;with nursing/sitter in room (in w/c with RN to transport to MRI) Nurse Communication: Mobility status PT Visit Diagnosis: Other symptoms and signs involving the nervous system (Z61.096)    Time: 0454-0981 PT Time Calculation (min) (ACUTE ONLY): 43 min   Charges:   PT Evaluation $PT Eval Low Complexity: 1 Low          Moishe Spice, PT, DPT Acute Rehabilitation Services  Pager: [phone] Office: [place] 10/21/2020, 10:26 AM

## 2020-10-21 NOTE — Progress Notes (Signed)
PROGRESS NOTE    Kenneth Grant  WNI:[id number] DOB: 1961/01/25 DOA: 10/15/2020 PCP: Pcp, No   Brief Narrative:  HPI on 10/15/2020 by Dr. Marva Grant This is a 60 year old male with no known past medical history who presents from jail with two marshals after he had an episode of dizziness and syncopal episode yesterday and hit his head. He says he was in his usual state of health up until that point when he was eating lunch, went to stand up and lost consciousness. Also complaining of weakness of the left hand, decreased hearing and slurred speech since yesterday as well around the same time.  Admits to moderate headache. Has a history of tobacco use for the past 20 years, changed from cigarettes to dip 5 years ago. Family history of lung cancer in father in his 60s. He denies any night sweats, weight changes, cough, dysuria or hematuria or any other complaints at this time.    Interim history Patient admitted to [facility] long for syncope.  Patient had also complained of left hand weakness and decreased hearing slurred speech and headache.  CT head showed right frontal lobe lesion with vasogenic edema.  CT abdomen pelvis noted a large left kidney mass consistent with renal cell carcinoma.  Brain MRI showed a solitary hypervascular and hypercellular 2.4 cm density.  Radiation oncology was consulted, patient underwent SRS on 5/26.  Also had renal biopsy on 5/25.  Patient was sent to [facility] for surgical resection of mass which occurred on 5/27. 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 -CT head showed right frontal lobe lesion with vasogenic edema -MRI brain showed solitary hypervascular and hypercellular 2.4 cm density -CT abdomen pelvis showed a large left kidney mass consistent with renal cell carcinoma -Status post left renal biopsy on 5/25-pathology pending -Radiation oncology consulted and appreciated, status post radiation oncology on  5/26 -Neurosurgery consulted and appreciated, status post right-sided craniotomy for tumor resection -Continue Decadron  Syncope with suspected seizure -Likely secondary to the above -Continue Keppra, dexamethasone -PT evaluated patient, no further therapy needed -OT recommended outpatient therapy  Tobacco abuse -Discussed cessation  Anxiety -Stable  Leukocytosis -Suspect reactive due to steroids -Continue to monitor CBC  DVT Prophylaxis SCDs  Code Status: Full  Family Communication: None at bedside  Disposition Plan:  Status is: Inpatient  Remains inpatient appropriate because:Inpatient level of care appropriate due to severity of illness   Dispo: The patient is from: [place]              Anticipated d/c is to: TBD              Patient currently is not medically stable to d/c.   Difficult to place patient No  Consultants Radiation oncology Oncology Neurosurgery Interventional radiology  Procedures  CT-guided left renal mass biopsy SRS  Right-sided craniotomy for tumor resection  Antibiotics   Anti-infectives (From admission, onward)   Start     Dose/Rate Route Frequency Ordered Stop   10/20/20 1800  ceFAZolin (ANCEF) IVPB 2g/100 mL premix        2 g 200 mL/hr over 30 Minutes Intravenous Every 8 hours 10/20/20 1734 10/21/20 0434   10/20/20 1127  vancomycin (VANCOCIN) 1-5 GM/200ML-% IVPB       Note to Pharmacy: [place]   : cabinet override      10/20/20 1127 10/20/20 2329   10/20/20 0600  vancomycin (VANCOREADY) IVPB 1000 mg/200 mL  Status:  Discontinued  1,000 mg 200 mL/hr over 60 Minutes Intravenous On call to O.R. 10/19/20 1804 10/19/20 1850   10/20/20 0600  vancomycin (VANCOREADY) IVPB 1000 mg/200 mL        1,000 mg 200 mL/hr over 60 Minutes Intravenous On call 10/19/20 1851 10/20/20 1322      Subjective:   Kenneth Grant seen and examined today.  Patient with no complaints today.  Denies current headache or dizziness.  Denies chest  pain or shortness of breath, abdominal pain, nausea or vomiting, diarrhea or constipation.    Objective:   Vitals:   10/21/20 0900 10/21/20 1100 10/21/20 1200 10/21/20 1300  BP: 120/71 129/70 (!) 116/59 131/69  Pulse: 85 66 65 67  Resp: (!) 21 13 16 17   Temp:   97.9 F (36.6 C)   TempSrc:   Oral   SpO2: 100% 91% 92% 94%  Weight:      Height:        Intake/Output Summary (Last 24 hours) at 10/21/2020 1406 Last data filed at 10/21/2020 1153 Gross per 24 hour  Intake 1030 ml  Output 2625 ml  Net -1595 ml   Filed Weights   10/15/20 0100 10/20/20 1123  Weight: 83.9 kg 83.9 kg    Exam  General: Well developed, well nourished, NAD, appears stated age  61: NCAT, incision noted on right side of temporal area, mucous membranes moist.   Neck: Supple  Cardiovascular: S1 S2 auscultated, RRR.  Respiratory: Clear to auscultation bilaterally, no wheezing  Abdomen: Soft, nontender, nondistended, + bowel sounds  Extremities: warm dry without cyanosis clubbing or edema  Neuro: AAOx3, diminished strength 4/5 LUE as well as bilateral lower extremities.  5/5 RUE.  LUE drift  Psych: appropriate mood and affect, pleasant   Data Reviewed: I have personally reviewed following labs and imaging studies  CBC: Recent Labs  Lab 10/15/20 0202 10/16/20 0605 10/17/20 0527 10/18/20 0542 10/19/20 0511 10/20/20 0248 10/20/20 1751  WBC 9.7   < > 17.1* 14.3* 10.9* 14.0* 21.1*  NEUTROABS 6.1  --   --   --   --   --   --   HGB 13.4   < > 12.8* 13.3 13.5 13.4 13.9  HCT 39.3   < > 38.4* 40.4 40.9 39.4 40.7  MCV 83.1   < > 85.3 85.4 84.2 82.3 82.7  PLT 207   < > 221 214 215 224 232   < > = values in this interval not displayed.   Basic Metabolic Panel: Recent Labs  Lab 10/15/20 0202 10/16/20 0605 10/17/20 0527 10/20/20 1751 10/21/20 0456  NA 137 135 137  --  135  K 4.0 5.4* 4.7  --  4.5  CL 103 104 105  --  103  CO2 25 23 24   --  24  GLUCOSE 107* 140* 144*  --  151*  BUN 16  28* 32*  --  24*  CREATININE 0.90 1.01 0.88 0.81 0.91  CALCIUM 9.4 9.3 9.2  --  8.6*  PHOS  --   --   --   --  3.5   GFR: Estimated Creatinine Clearance: 93.6 mL/min (by C-G formula based on SCr of 0.91 mg/dL). Liver Function Tests: Recent Labs  Lab 10/21/20 0456  ALBUMIN 3.0*   No results for input(s): LIPASE, AMYLASE in the last 168 hours. No results for input(s): AMMONIA in the last 168 hours. Coagulation Profile: Recent Labs  Lab 10/17/20 0527  INR 1.0   Cardiac Enzymes: No results for input(s):  CKTOTAL, CKMB, CKMBINDEX, TROPONINI in the last 168 hours. BNP (last 3 results) No results for input(s): PROBNP in the last 8760 hours. HbA1C: No results for input(s): HGBA1C in the last 72 hours. CBG: No results for input(s): GLUCAP in the last 168 hours. Lipid Profile: No results for input(s): CHOL, HDL, LDLCALC, TRIG, CHOLHDL, LDLDIRECT in the last 72 hours. Thyroid Function Tests: No results for input(s): TSH, T4TOTAL, FREET4, T3FREE, THYROIDAB in the last 72 hours. Anemia Panel: No results for input(s): VITAMINB12, FOLATE, FERRITIN, TIBC, IRON, RETICCTPCT in the last 72 hours. Urine analysis: No results found for: COLORURINE, APPEARANCEUR, LABSPEC, PHURINE, GLUCOSEU, HGBUR, BILIRUBINUR, KETONESUR, PROTEINUR, UROBILINOGEN, NITRITE, LEUKOCYTESUR Sepsis Labs: @LABRCNTIP (procalcitonin:4,lacticidven:4)  ) Recent Results (from the past 240 hour(s))  Resp Panel by RT-PCR (Flu A&B, Covid) Nasopharyngeal Swab     Status: None   Collection Time: 10/15/20  4:11 AM   Specimen: Nasopharyngeal Swab; Nasopharyngeal(NP) swabs in vial transport medium  Result Value Ref Range Status   SARS Coronavirus 2 by RT PCR NEGATIVE NEGATIVE Final    Comment: (NOTE) SARS-CoV-2 target nucleic acids are NOT DETECTED.  The SARS-CoV-2 RNA is generally detectable in upper respiratory specimens during the acute phase of infection. The lowest concentration of SARS-CoV-2 viral copies this assay can  detect is 138 copies/mL. A negative result does not preclude SARS-Cov-2 infection and should not be used as the sole basis for treatment or other patient management decisions. A negative result may occur with  improper specimen collection/handling, submission of specimen other than nasopharyngeal swab, presence of viral mutation(s) within the areas targeted by this assay, and inadequate number of viral copies(<138 copies/mL). A negative result must be combined with clinical observations, patient history, and epidemiological information. The expected result is Negative.  Fact Sheet for Patients:  EntrepreneurPulse.com.au  Fact Sheet for Healthcare Providers:  IncredibleEmployment.be  This test is no t yet approved or cleared by the [place] FDA and  has been authorized for detection and/or diagnosis of SARS-CoV-2 by FDA under an Emergency Use Authorization (EUA). This EUA will remain  in effect (meaning this test can be used) for the duration of the COVID-19 declaration under Section 564(b)(1) of the Act, 21 U.S.C.section 360bbb-3(b)(1), unless the authorization is terminated  or revoked sooner.       Influenza A by PCR NEGATIVE NEGATIVE Final   Influenza B by PCR NEGATIVE NEGATIVE Final    Comment: (NOTE) The Xpert Xpress SARS-CoV-2/FLU/RSV plus assay is intended as an aid in the diagnosis of influenza from Nasopharyngeal swab specimens and should not be used as a sole basis for treatment. Nasal washings and aspirates are unacceptable for Xpert Xpress SARS-CoV-2/FLU/RSV testing.  Fact Sheet for Patients: EntrepreneurPulse.com.au  Fact Sheet for Healthcare Providers: IncredibleEmployment.be  This test is not yet approved or cleared by the [place] FDA and has been authorized for detection and/or diagnosis of SARS-CoV-2 by FDA under an Emergency Use Authorization (EUA). This EUA will remain in  effect (meaning this test can be used) for the duration of the COVID-19 declaration under Section 564(b)(1) of the Act, 21 U.S.C. section 360bbb-3(b)(1), unless the authorization is terminated or revoked.  Performed at [facility], [place] [address] [place], [place] [address]   Surgical pcr screen     Status: None   Collection Time: 10/19/20  8:28 PM   Specimen: Nasal Mucosa; Nasal Swab  Result Value Ref Range Status   MRSA, PCR NEGATIVE NEGATIVE Final   Staphylococcus aureus NEGATIVE NEGATIVE Final  Comment: (NOTE) The Xpert SA Assay (FDA approved for NASAL specimens in patients 3 years of age and older), is one component of a comprehensive surveillance program. It is not intended to diagnose infection nor to guide or monitor treatment. Performed at [facility], [place] [address] [place], [place] [address]       Radiology Studies: MR BRAIN W WO CONTRAST  Result Date: 10/21/2020 CLINICAL DATA:  Brain mass or lesion status post craniotomy for tumor resection. EXAM: MRI HEAD WITHOUT AND WITH CONTRAST TECHNIQUE: Multiplanar, multiecho pulse sequences of the brain and surrounding structures were obtained without and with intravenous contrast. CONTRAST:  8.76mL GADAVIST GADOBUTROL 1 MMOL/ML IV SOLN COMPARISON:  MRI Oct 17, 2020 FINDINGS: Brain: Interval right frontal craniotomy and resection of the previously seen right frontal enhancing lesion. There is expected postsurgical changes including mild/linear enhancement and restricted diffusion about the periphery of the resection cavity and blood products/susceptibility artifact throughout the resection cavity. No nodular/masslike enhancement to suggest residual tumor. There is exuberant surrounding vasogenic edema which is similar to prior. Similar mass effect with mild effacement of the right lateral ventricle. No significant midline shift. No new enhancing lesions identified. No hydrocephalus. Punctate nonenhancing  areas of restricted diffusion in the periventricular right parietal lobe (series 2, images 31/32) likely represent tiny acute infarcts. No significant associated edema or mass effect. Vascular: Major arterial flow voids are maintained at the skull base. Skull and upper cervical spine: Right frontal craniotomy. Otherwise, normal marrow signal. Sinuses/Orbits: Mild ethmoid air cell mucosal thickening without air-fluid levels. Unremarkable orbits. Other: No sizable mastoid effusions. IMPRESSION: 1. New punctate nonenhancing areas of restricted diffusion in the periventricular right parietal lobe likely represent tiny acute infarcts. No significant associated edema or mass effect. Recommend attention on follow-up. 2. Interval right frontal craniotomy and resection of a right frontal metastasis with expected postsurgical findings at the resection site and no nodular enhancement to suggest residual tumor. Similar exuberant surrounding edema and mass effect. Electronically Signed   By: Margaretha Sheffield MD   On: 10/21/2020 10:25     Scheduled Meds: . Chlorhexidine Gluconate Cloth  6 each Topical Daily  . dexamethasone  4 mg Oral Q8H  . docusate sodium  100 mg Oral BID  . [START ON 10/22/2020] heparin injection (subcutaneous)  5,000 Units Subcutaneous Q8H  . levETIRAcetam  500 mg Oral BID  . pantoprazole  40 mg Oral Daily   Continuous Infusions:    LOS: 6 days   Time Spent in minutes   30 minutes  Brinlynn Gorton D.O. on 10/21/2020 at 2:06 PM  Between 7am to 7pm - Please see pager noted on [email or web address]  After 7pm go to [email or web address]  And look for the night coverage person covering for me after hours  [facility] Office  [phone]

## 2020-10-21 NOTE — Progress Notes (Signed)
Subjective: Patient reports "I'm doing alright." No acute events overnight.   Objective: Vital signs in last 24 hours: Temp:  [97.9 F (36.6 C)-98.5 F (36.9 C)] 97.9 F (36.6 C) (05/28 0800) Pulse Rate:  [54-93] 71 (05/28 0800) Resp:  [7-20] 19 (05/28 0800) BP: (98-126)/(51-75) 126/60 (05/28 0800) SpO2:  [92 %-100 %] 100 % (05/28 0800) Arterial Line BP: (94-179)/(42-67) 148/60 (05/27 2200) Weight:  [83.9 kg] 83.9 kg (05/27 1123)  Intake/Output from previous day: 05/27 0701 - 05/28 0700 In: 1478 [I.V.:810; IV Piggyback:568] Out: 2925 [Urine:2725; Blood:200] Intake/Output this shift: No intake/output data recorded.  Physical Exam: Patient is awake and A/O X 4. Doing well. MAEW. Does have LUE weakness and left facial asymmetry.  PERRLA.   Lab Results: Recent Labs    10/20/20 0248 10/20/20 1751  WBC 14.0* 21.1*  HGB 13.4 13.9  HCT 39.4 40.7  PLT 224 232   BMET Recent Labs    10/20/20 1751 10/21/20 0456  NA  --  135  K  --  4.5  CL  --  103  CO2  --  24  GLUCOSE  --  151*  BUN  --  24*  CREATININE 0.81 0.91  CALCIUM  --  8.6*    Studies/Results: No results found.  Assessment/Plan: 60 y.o. male who is post op day 1 s/p right craniotomy for tumor resection. Patient is doing well. Patient will likely need outpatient SLP.    -continue dex -MRI w/wo contrast pending -diet and activity as tolerated   LOS: 6 days     Marvis Moeller, DNP, NP-C 10/21/2020, 9:06 AM

## 2020-10-22 DIAGNOSIS — D496 Neoplasm of unspecified behavior of brain: Secondary | ICD-10-CM | POA: Diagnosis not present

## 2020-10-22 DIAGNOSIS — C799 Secondary malignant neoplasm of unspecified site: Secondary | ICD-10-CM | POA: Diagnosis not present

## 2020-10-22 DIAGNOSIS — G9389 Other specified disorders of brain: Secondary | ICD-10-CM | POA: Diagnosis not present

## 2020-10-22 DIAGNOSIS — N2889 Other specified disorders of kidney and ureter: Secondary | ICD-10-CM | POA: Diagnosis not present

## 2020-10-22 MED ORDER — LEVETIRACETAM 500 MG PO TABS: 500.0000 mg | ORAL_TABLET | Freq: Two times a day (BID) | ORAL | 5 refills | Status: AC

## 2020-10-22 NOTE — Discharge Summary (Signed)
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 The left renal mass was biopsied by IR, prelim was RCC. He had preop SRS to the right sided frontal brain tumor and was taken to the OR on 5/27 for craniotomy and resection. Post-op MRI showed gross total resection of the tumor. He was recovered in PACU and transferred to 4N. His hospital course was uncomplicated and the patient was discharged home on 5/29. Given his possible seizure, he was discharged on keppra with seizure precaution instructions. He will follow up in clinic with me in 2 weeks.  Neurologic exam at discharge:  AOx3, PERRL, EOMI, +L UMN facial droop, TM Strength 5/5 except LUE 4 to 4+/5, SILTx4  Discharge diagnosis: Brain tumor  Judith Part, MD 10/22/20 1:24 PM

## 2020-10-22 NOTE — Progress Notes (Signed)
Pt discharged to home. DC instructions given with girlfriend, Andi, at bedside. No concerns voiced. Pt left unit in wheelchair pushed by this Probation officer. Left in stable condition. Prescription x 1 given. Education provided re significance of med (Keppra) and pt encouraged to get med filled and continue taking. Pt verbalized understanding.  VWilliams, Therapist, sports.

## 2020-10-22 NOTE — Progress Notes (Signed)
Neurosurgery Service Progress Note  Subjective: No acute events overnight, no complaints, speech and LUD significantly improving  Objective: Vitals:   10/21/20 2347 10/22/20 0404 10/22/20 0715 10/22/20 1138  BP: 120/64  112/67 122/68  Pulse:   65 75  Resp:   20 18  Temp: 98.3 F (36.8 C) 98.4 F (36.9 C) 98 F (36.7 C) 98.4 F (36.9 C)  TempSrc: Oral Oral Oral Oral  SpO2:   97% 96%  Weight:      Height:        Physical Exam: AOx3, PERRL, EOMI, TM, Strength 5/5 on R, 4+/5 on L with improving but predsednt facial droop  Assessment & Plan: 60 y.o. man s/p crani for resection of suspected RCC met, recovering well.  -can discharge home today  Judith Part  10/22/20 1:02 PM

## 2020-10-22 NOTE — Progress Notes (Signed)
Occupational Therapy Treatment Patient Details Name: Kenneth Grant MRN: [id number] DOB: May 29, 1960 Today's Date: 10/22/2020    History of present illness Pt is a 60 y.o. male who presented 5/22 with an episode of dizziness and syncope resulting in him hitting his head. Pt also with L hand weakness, headache, decreased hearing, and slurred speech. Imaging revealed a R frontal lobe mass with vasogenic edema and mass effect concerning for malignancy and diffuse metastatic disease. Microhemorrhage within the mass with regional mass effect but no midline shift. CT chest/abd/pelvis: Large left kidney mass consistent with RCC and possible small solid mass in the lower pole of the left kidney which is suspicious for a second malignancy. Small solid mass in the lower pole of the right kidney. Numerous bilateral pulmonary nodules and Bilateral adrenal nodules consistent with metastatic disease. Small right inferior hepatic lobe lesion. S/p [place] L renal mass core bx 5/25. S/p R crani for tumor resection 5/27. No known past medical hx.   OT comments  Spoke with pt's caregiver and reiterated need for 24 hour supervision.  She voices concern re: pt's safety and that he won't listen to her.  Discussed strategies to redirect pt.  Long discussion with pt re: safety and strategies to manage his confusion.  He is able to repeat this back, but is impulsive, and am not sure he will be able to carry over info or strategies.    Follow Up Recommendations  Outpatient OT;Other (comment) (OP SLP)    Equipment Recommendations  None recommended by OT    Recommendations for Other Services      Precautions / Restrictions Precautions Precautions: Fall Precaution Comments: Pt impulsive and distractible       Mobility Bed Mobility Overal bed mobility: Independent                  Transfers Overall transfer level: Needs assistance   Transfers: Sit to/from Stand;Stand Pivot Transfers Sit to Stand:  Supervision Stand pivot transfers: Supervision            Balance                                           ADL either performed or assessed with clinical judgement   ADL                                         General ADL Comments: Pt requires supervision with ADLs due to impaired attention and memory deficits.  Needs supervision to ensure accuracy and thoroughness of tasks     Vision       Perception     Praxis      Cognition Arousal/Alertness: Awake/alert Behavior During Therapy: Impulsive Overall Cognitive Status: Impaired/Different from baseline Area of Impairment: Attention;Memory;Safety/judgement;Problem solving;Awareness;Following commands                   Current Attention Level: Selective Memory: Decreased short-term memory Following Commands: Follows multi-step commands inconsistently Safety/Judgement: Decreased awareness of deficits;Decreased awareness of safety Awareness: Emergent Problem Solving: Difficulty sequencing General Comments: Spoke with patient's girlfriend who reports pt is typically "wide open" at baseline, and won't listen to anyone.  She reports he is not cogntively at baseline and is "confused".  Pt demonstrates decreased carry over of info provided and requires information/instruction  to be repeated several times.  He, however, is able to recall info that he did yesterday including details of tasks I asked him to do during OT        Exercises Exercises: Other exercises Other Exercises Other Exercises: Provided girlfriend with strategies to redirect pt.  do not argue with pt; try to redirect acvtivity or conversation to something else.   Reinforced need for pt to have 24 hour supervision at discharge Other Exercises: Long discussion with pt re: his current deficits and the cause of his deficits.  He frequently will state "well that's why I am confused".  He asked a lot of questions re: his cancer  and process for follow up.   Reinforced to pt that he is not cognitively intact and that he needs to listen to his caregivers at home to ensure his safety.  He was able to verbalize understanding of this info, however, am unsure if he will be able to carry over this information   Shoulder Instructions       General Comments      Pertinent Vitals/ Pain       Pain Assessment: Faces Faces Pain Scale: Hurts a little bit Pain Location: headache Pain Descriptors / Indicators: Headache;Pressure Pain Intervention(s): Monitored during session  Home Living                                          Prior Functioning/Environment              Frequency  Min 2X/week        Progress Toward Goals  OT Goals(current goals can now be found in the care plan section)  Progress towards OT goals: Progressing toward goals     Plan Discharge plan remains appropriate    Co-evaluation                 AM-PAC OT "6 Clicks" Daily Activity     Outcome Measure   Help from another person eating meals?: None Help from another person taking care of personal grooming?: A Little Help from another person toileting, which includes using toliet, bedpan, or urinal?: A Little Help from another person bathing (including washing, rinsing, drying)?: A Little Help from another person to put on and taking off regular upper body clothing?: A Little Help from another person to put on and taking off regular lower body clothing?: A Little 6 Click Score: 19    End of Session    OT Visit Diagnosis: Cognitive communication deficit (R41.841)   Activity Tolerance Patient tolerated treatment well   Patient Left in bed;with call bell/phone within reach   Nurse Communication Mobility status        Time: 3559-7416 OT Time Calculation (min): 31 min  Charges: OT General Charges $OT Visit: 1 Visit OT Treatments $Self Care/Home Management : 23-37 mins  Nilsa Nutting., OTR/L Acute  Rehabilitation Services Pager [phone] Office [phone]    Lucille Passy M 10/22/2020, 2:19 PM

## 2020-10-22 NOTE — Discharge Instructions (Addendum)
Discharge Instructions  No restriction in activities, slowly increase your activity back to normal.   Your incision is closed with absorbable sutures. These will naturally fall off over the next 4-6 weeks. If they become bothersome or cause discomfort, apply some antibiotic ointment like bacitracin or neosporin on the sutures. This will soften them up and usually makes them more comfortable while they dissolve.  Okay to shower on the day of discharge. Be gentle when cleaning your incision. Use regular soap and water. If that is uncomfortable, try using baby shampoo. Do not submerge the wound under water for 2 weeks after surgery.  Follow up with Dr. Zada Finders in 2 weeks after discharge. If you do not already have a discharge appointment, please call his office at [phone] to schedule a follow up appointment. If you have any concerns or questions, please call the office and let us know.  Seizure Precautions  Activity  Follow instructions about activities, such as driving or swimming, that would be dangerous if you had another seizure. Wait until your health care provider says it is safe to do them.  In [place], you cannot drive for 6 months after a seizure.  Get enough rest. Lack of sleep can make seizures more likely to occur. Educating others  Teach friends and family what to do if you have a seizure. They should: ? Help you get down to the ground, to prevent a fall. ? Cushion your head and move items away from your body. ? Loosen any tight clothing around your neck. ? Turn you on your side. If you vomit, this helps keep your airway clear. ? Know whether or not you need emergency care. ? Stay with you until you recover.  Also, tell them what not to do if you have a seizure. Tell them: ? They should not hold you down. Holding you down will not stop the seizure. ? They should not put anything in your mouth.    General instructions  Avoid anything that has ever triggered  a seizure for you.  Keep a seizure diary. Record what you remember about each seizure, especially anything that might have triggered it.  Keep all follow-up visits. This is important. Contact a health care provider if:  You have another seizure or seizures. Call each time you have a seizure.  Your seizure pattern changes.  You continue to have seizures with treatment.  You have symptoms of an infection or illness. Either of these might increase your risk of having a seizure.  You are unable to take your medicine. Get help right away if:  You have: ? A seizure that does not stop after 5 minutes. ? Several seizures in a row without a complete recovery between seizures. ? A seizure that makes it harder to breathe. ? A seizure that leaves you unable to speak or use a part of your body.  You do not wake up right away after a seizure.  You injure yourself during a seizure.  You have confusion or pain right after a seizure. These symptoms may represent a serious problem that is an emergency. Do not wait to see if the symptoms will go away. Get medical help right away. Call your local emergency services (911 in the [place] Do not drive yourself to the hospital.

## 2020-10-22 NOTE — Progress Notes (Signed)
PROGRESS NOTE    Kenneth Grant  HKV:[id number] DOB: June 05, 1960 DOA: 10/15/2020 PCP: Pcp, No   Brief Narrative:  HPI on 10/15/2020 by Dr. Marva Panda This is a 60 year old male with no known past medical history who presents from jail with two marshals after he had an episode of dizziness and syncopal episode yesterday and hit his head. He says he was in his usual state of health up until that point when he was eating lunch, went to stand up and lost consciousness. Also complaining of weakness of the left hand, decreased hearing and slurred speech since yesterday as well around the same time.  Admits to moderate headache. Has a history of tobacco use for the past 20 years, changed from cigarettes to dip 5 years ago. Family history of lung cancer in father in his 54s. He denies any night sweats, weight changes, cough, dysuria or hematuria or any other complaints at this time.    Interim history Patient admitted to [facility] long for syncope.  Patient had also complained of left hand weakness and decreased hearing slurred speech and headache.  CT head showed right frontal lobe lesion with vasogenic edema.  CT abdomen pelvis noted a large left kidney mass consistent with renal cell carcinoma.  Brain MRI showed a solitary hypervascular and hypercellular 2.4 cm density.  Radiation oncology was consulted, patient underwent SRS on 5/26.  Also had renal biopsy on 5/25.  Patient was sent to [facility] for surgical resection of mass which occurred on 5/27. 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 -CT head showed right frontal lobe lesion with vasogenic edema -MRI brain showed solitary hypervascular and hypercellular 2.4 cm density -CT abdomen pelvis showed a large left kidney mass consistent with renal cell carcinoma -Status post left renal biopsy on 5/25-pathology pending -Radiation oncology consulted and appreciated, status post radiation oncology on  5/26 -Neurosurgery consulted and appreciated, status post right-sided craniotomy for tumor resection -Continue Decadron  Syncope with suspected seizure -Likely secondary to the above -Continue Keppra, dexamethasone -PT evaluated patient, no further therapy needed -OT recommended outpatient therapy  Tobacco abuse -Discussed cessation  Anxiety -Stable  Leukocytosis -Suspect reactive due to steroids -Continue to monitor CBC  DVT Prophylaxis heparin  Code Status: Full  Family Communication: None at bedside  Disposition Plan: Per primary Status is: Inpatient  Remains inpatient appropriate because:Inpatient level of care appropriate due to severity of illness   Dispo: The patient is from: [place]              Anticipated d/c is to: Home              Patient currently is not medically stable to d/c.   Difficult to place patient No  Consultants Radiation oncology Oncology Neurosurgery Interventional radiology  Procedures  CT-guided left renal mass biopsy SRS  Right-sided craniotomy for tumor resection  Antibiotics   Anti-infectives (From admission, onward)   Start     Dose/Rate Route Frequency Ordered Stop   10/20/20 1800  ceFAZolin (ANCEF) IVPB 2g/100 mL premix        2 g 200 mL/hr over 30 Minutes Intravenous Every 8 hours 10/20/20 1734 10/21/20 0434   10/20/20 1127  vancomycin (VANCOCIN) 1-5 GM/200ML-% IVPB       Note to Pharmacy: [place]   : cabinet override      10/20/20 1127 10/20/20 2329   10/20/20 0600  vancomycin (VANCOREADY) IVPB 1000 mg/200 mL  Status:  Discontinued  1,000 mg 200 mL/hr over 60 Minutes Intravenous On call to O.R. 10/19/20 1804 10/19/20 1850   10/20/20 0600  vancomycin (VANCOREADY) IVPB 1000 mg/200 mL        1,000 mg 200 mL/hr over 60 Minutes Intravenous On call 10/19/20 1851 10/20/20 1322      Subjective:   [name] seen and examined today.  Patient with no complaints this morning.  Feels that his left arm weakness  is mildly improved and he is able to use his hand more.  Denies current chest pain or shortness of breath, abdominal pain, dizziness or headache.   Objective:   Vitals:   10/21/20 2347 10/22/20 0404 10/22/20 0715 10/22/20 1138  BP: 120/64  112/67 122/68  Pulse:   65 75  Resp:   20 18  Temp: 98.3 F (36.8 C) 98.4 F (36.9 C) 98 F (36.7 C) 98.4 F (36.9 C)  TempSrc: Oral Oral Oral Oral  SpO2:   97% 96%  Weight:      Height:        Intake/Output Summary (Last 24 hours) at 10/22/2020 1237 Last data filed at 10/22/2020 0840 Gross per 24 hour  Intake 480 ml  Output --  Net 480 ml   Filed Weights   10/15/20 0100 10/20/20 1123  Weight: 83.9 kg 83.9 kg   Exam  General: Well developed, well nourished, NAD, appears stated age  54: NCAT, and noted on the right temporal area, mucous membranes moist.   Cardiovascular: S1 S2 auscultated, RRR, no murmur  Respiratory: Clear to auscultation bilaterally  Abdomen: Soft, nontender, nondistended, + bowel sounds  Extremities: warm dry without cyanosis clubbing or edema  Neuro: AAOx3, diminished strength, 4/5 LUE as well as bilateral lower extremities.  RUE 5/5 strength.    Psych: pleasant, appropriate mood and affect  Data Reviewed: I have personally reviewed following labs and imaging studies  CBC: Recent Labs  Lab 10/17/20 0527 10/18/20 0542 10/19/20 0511 10/20/20 0248 10/20/20 1751  WBC 17.1* 14.3* 10.9* 14.0* 21.1*  HGB 12.8* 13.3 13.5 13.4 13.9  HCT 38.4* 40.4 40.9 39.4 40.7  MCV 85.3 85.4 84.2 82.3 82.7  PLT 221 214 215 224 786   Basic Metabolic Panel: Recent Labs  Lab 10/16/20 0605 10/17/20 0527 10/20/20 1751 10/21/20 0456  NA 135 137  --  135  K 5.4* 4.7  --  4.5  CL 104 105  --  103  CO2 23 24  --  24  GLUCOSE 140* 144*  --  151*  BUN 28* 32*  --  24*  CREATININE 1.01 0.88 0.81 0.91  CALCIUM 9.3 9.2  --  8.6*  PHOS  --   --   --  3.5   GFR: Estimated Creatinine Clearance: 93.6 mL/min (by C-G  formula based on SCr of 0.91 mg/dL). Liver Function Tests: Recent Labs  Lab 10/21/20 0456  ALBUMIN 3.0*   No results for input(s): LIPASE, AMYLASE in the last 168 hours. No results for input(s): AMMONIA in the last 168 hours. Coagulation Profile: Recent Labs  Lab 10/17/20 0527  INR 1.0   Cardiac Enzymes: No results for input(s): CKTOTAL, CKMB, CKMBINDEX, TROPONINI in the last 168 hours. BNP (last 3 results) No results for input(s): PROBNP in the last 8760 hours. HbA1C: No results for input(s): HGBA1C in the last 72 hours. CBG: No results for input(s): GLUCAP in the last 168 hours. Lipid Profile: No results for input(s): CHOL, HDL, LDLCALC, TRIG, CHOLHDL, LDLDIRECT in the last 72 hours. Thyroid  Function Tests: No results for input(s): TSH, T4TOTAL, FREET4, T3FREE, THYROIDAB in the last 72 hours. Anemia Panel: No results for input(s): VITAMINB12, FOLATE, FERRITIN, TIBC, IRON, RETICCTPCT in the last 72 hours. Urine analysis: No results found for: COLORURINE, APPEARANCEUR, LABSPEC, PHURINE, GLUCOSEU, HGBUR, BILIRUBINUR, KETONESUR, PROTEINUR, UROBILINOGEN, NITRITE, LEUKOCYTESUR Sepsis Labs: @LABRCNTIP (procalcitonin:4,lacticidven:4)  ) Recent Results (from the past 240 hour(s))  Resp Panel by RT-PCR (Flu A&B, Covid) Nasopharyngeal Swab     Status: None   Collection Time: 10/15/20  4:11 AM   Specimen: Nasopharyngeal Swab; Nasopharyngeal(NP) swabs in vial transport medium  Result Value Ref Range Status   SARS Coronavirus 2 by RT PCR NEGATIVE NEGATIVE Final    Comment: (NOTE) SARS-CoV-2 target nucleic acids are NOT DETECTED.  The SARS-CoV-2 RNA is generally detectable in upper respiratory specimens during the acute phase of infection. The lowest concentration of SARS-CoV-2 viral copies this assay can detect is 138 copies/mL. A negative result does not preclude SARS-Cov-2 infection and should not be used as the sole basis for treatment or other patient management decisions. A  negative result may occur with  improper specimen collection/handling, submission of specimen other than nasopharyngeal swab, presence of viral mutation(s) within the areas targeted by this assay, and inadequate number of viral copies(<138 copies/mL). A negative result must be combined with clinical observations, patient history, and epidemiological information. The expected result is Negative.  Fact Sheet for Patients:  EntrepreneurPulse.com.au  Fact Sheet for Healthcare Providers:  IncredibleEmployment.be  This test is no t yet approved or cleared by the [place] FDA and  has been authorized for detection and/or diagnosis of SARS-CoV-2 by FDA under an Emergency Use Authorization (EUA). This EUA will remain  in effect (meaning this test can be used) for the duration of the COVID-19 declaration under Section 564(b)(1) of the Act, 21 U.S.C.section 360bbb-3(b)(1), unless the authorization is terminated  or revoked sooner.       Influenza A by PCR NEGATIVE NEGATIVE Final   Influenza B by PCR NEGATIVE NEGATIVE Final    Comment: (NOTE) The Xpert Xpress SARS-CoV-2/FLU/RSV plus assay is intended as an aid in the diagnosis of influenza from Nasopharyngeal swab specimens and should not be used as a sole basis for treatment. Nasal washings and aspirates are unacceptable for Xpert Xpress SARS-CoV-2/FLU/RSV testing.  Fact Sheet for Patients: EntrepreneurPulse.com.au  Fact Sheet for Healthcare Providers: IncredibleEmployment.be  This test is not yet approved or cleared by the [place] FDA and has been authorized for detection and/or diagnosis of SARS-CoV-2 by FDA under an Emergency Use Authorization (EUA). This EUA will remain in effect (meaning this test can be used) for the duration of the COVID-19 declaration under Section 564(b)(1) of the Act, 21 U.S.C. section 360bbb-3(b)(1), unless the authorization  is terminated or revoked.  Performed at [facility], [place] [address] [place], [place] [address]   Surgical pcr screen     Status: None   Collection Time: 10/19/20  8:28 PM   Specimen: Nasal Mucosa; Nasal Swab  Result Value Ref Range Status   MRSA, PCR NEGATIVE NEGATIVE Final   Staphylococcus aureus NEGATIVE NEGATIVE Final    Comment: (NOTE) The Xpert SA Assay (FDA approved for NASAL specimens in patients 20 years of age and older), is one component of a comprehensive surveillance program. It is not intended to diagnose infection nor to guide or monitor treatment. Performed at [facility], [place] [address] [place], [place] [address]       Radiology Studies: MR  BRAIN W WO CONTRAST  Result Date: 10/21/2020 CLINICAL DATA:  Brain mass or lesion status post craniotomy for tumor resection. EXAM: MRI HEAD WITHOUT AND WITH CONTRAST TECHNIQUE: Multiplanar, multiecho pulse sequences of the brain and surrounding structures were obtained without and with intravenous contrast. CONTRAST:  8.30mL GADAVIST GADOBUTROL 1 MMOL/ML IV SOLN COMPARISON:  MRI Oct 17, 2020 FINDINGS: Brain: Interval right frontal craniotomy and resection of the previously seen right frontal enhancing lesion. There is expected postsurgical changes including mild/linear enhancement and restricted diffusion about the periphery of the resection cavity and blood products/susceptibility artifact throughout the resection cavity. No nodular/masslike enhancement to suggest residual tumor. There is exuberant surrounding vasogenic edema which is similar to prior. Similar mass effect with mild effacement of the right lateral ventricle. No significant midline shift. No new enhancing lesions identified. No hydrocephalus. Punctate nonenhancing areas of restricted diffusion in the periventricular right parietal lobe (series 2, images 31/32) likely represent tiny acute infarcts. No significant associated edema or mass effect.  Vascular: Major arterial flow voids are maintained at the skull base. Skull and upper cervical spine: Right frontal craniotomy. Otherwise, normal marrow signal. Sinuses/Orbits: Mild ethmoid air cell mucosal thickening without air-fluid levels. Unremarkable orbits. Other: No sizable mastoid effusions. IMPRESSION: 1. New punctate nonenhancing areas of restricted diffusion in the periventricular right parietal lobe likely represent tiny acute infarcts. No significant associated edema or mass effect. Recommend attention on follow-up. 2. Interval right frontal craniotomy and resection of a right frontal metastasis with expected postsurgical findings at the resection site and no nodular enhancement to suggest residual tumor. Similar exuberant surrounding edema and mass effect. Electronically Signed   By: Margaretha Sheffield MD   On: 10/21/2020 10:25     Scheduled Meds: . Chlorhexidine Gluconate Cloth  6 each Topical Daily  . dexamethasone  4 mg Oral Q8H  . docusate sodium  100 mg Oral BID  . heparin injection (subcutaneous)  5,000 Units Subcutaneous Q8H  . levETIRAcetam  500 mg Oral BID  . pantoprazole  40 mg Oral Daily   Continuous Infusions:    LOS: 7 days   Time Spent in minutes   30 minutes  Marquis Down D.O. on 10/22/2020 at 12:37 PM  Between 7am to 7pm - Please see pager noted on [email or web address]  After 7pm go to [email or web address]  And look for the night coverage person covering for me after hours  [facility] Office  [phone]

## 2020-10-22 NOTE — Progress Notes (Signed)
Attempted to discharge Pt; but Pt's ride will not be in until 1500. Will discharge Pt then.  VWilliams,RN.

## 2020-10-24 ENCOUNTER — Other Ambulatory Visit: Payer: Self-pay | Admitting: Oncology

## 2020-10-24 ENCOUNTER — Telehealth: Payer: Self-pay | Admitting: Oncology

## 2020-10-24 ENCOUNTER — Encounter (HOSPITAL_COMMUNITY): Payer: Self-pay | Admitting: Neurological Surgery

## 2020-10-24 DIAGNOSIS — C642 Malignant neoplasm of left kidney, except renal pelvis: Secondary | ICD-10-CM

## 2020-10-24 DIAGNOSIS — C7931 Secondary malignant neoplasm of brain: Secondary | ICD-10-CM

## 2020-10-24 LAB — SURGICAL PATHOLOGY

## 2020-10-24 NOTE — Telephone Encounter (Signed)
Received a hem referral from Mikey Bussing, NP for renal cell carcinoma w/brain mets. Mr. Obryant has been scheduled to see Dr. Alen Blew on 6/14 at 2pm in order for pathology to be resulted in time. Appt date and time has been given to the pt's wife. Aware to arrive 20 minutes early.

## 2020-10-25 LAB — SURGICAL PATHOLOGY

## 2020-10-30 ENCOUNTER — Encounter

## 2020-10-30 NOTE — Progress Notes (Signed)
  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  Intent: Palliative  Radiation Treatment Dates: 10/19/2020 through 10/19/2020 Preoperative SRS Site Technique Total Dose (Gy) Dose per Fx (Gy) Completed Fx Beam Energies  Brain: Brain_SRS IMRT 18/18 18 1/1 6XFFF   Narrative: The patient tolerated radiation therapy relatively well. He was in federal custody during treatment but was released on bond and will follow up as an outpatient. His surgery will take place on 10/20/20 with Dr. Zada Finders.  Plan: The patient will receive a call in about one month from the radiation oncology department. He will meet  with Dr. Alen Blew in medical oncology as well as be followed in the brain and spine oncology program with Dr. Mickeal Skinner. ________________________________________________    Carola Rhine, PAC

## 2020-11-01 NOTE — Op Note (Signed)
  Name: Kenneth Grant  MRN: [id number]  Date: 10/15/2020   DOB: 05-24-1961  Stereotactic Radiosurgery Operative Note  PRE-OPERATIVE DIAGNOSIS:  Multiple Brain Metastases  POST-OPERATIVE DIAGNOSIS:  Multiple Brain Metastases  PROCEDURE:  Stereotactic Radiosurgery  SURGEON:  Judith Part, MD  NARRATIVE: The patient underwent a radiation treatment planning session in the radiation oncology simulation suite under the care of the radiation oncology physician and physicist.  I participated closely in the radiation treatment planning afterwards. The patient underwent planning CT which was fused to 3T high resolution MRI with 1 mm axial slices.  These images were fused on the planning system.  We contoured the gross target volumes and subsequently expanded this to yield the Planning Target Volume. I actively participated in the planning process.  I helped to define and review the target contours and also the contours of the optic pathway, eyes, brainstem and selected nearby organs at risk.  All the dose constraints for critical structures were reviewed and compared to AAPM Task Group 101.  The prescription dose conformity was reviewed.  I approved the plan electronically.    Accordingly, Kenneth Grant was brought to the TrueBeam stereotactic radiation treatment linac and placed in the custom immobilization mask.  The patient was aligned according to the IR fiducial markers with BrainLab Exactrac, then orthogonal x-rays were used in ExacTrac with the 6DOF robotic table and the shifts were made to align the patient  Kenneth Grant received stereotactic radiosurgery uneventfully.    Lesions treated:  1   Complex lesions treated:  0 (>3.5 cm, <57mm of optic path, or within the brainstem)   The detailed description of the procedure is recorded in the radiation oncology procedure note.  I was present for the duration of the procedure.  DISPOSITION:  Following delivery, the patient was transported to  nursing in stable condition and monitored for possible acute effects to be discharged to home in stable condition with follow-up in one month.  Judith Part, MD 11/01/2020 6:34 PM

## 2020-11-07 ENCOUNTER — Other Ambulatory Visit: Payer: Self-pay

## 2020-11-07 ENCOUNTER — Inpatient Hospital Stay: Payer: Self-pay | Attending: Oncology | Admitting: Oncology

## 2020-11-07 VITALS — BP 125/73 | HR 86 | Temp 98.0°F | Resp 17 | Wt 193.6 lb

## 2020-11-07 DIAGNOSIS — Z79899 Other long term (current) drug therapy: Secondary | ICD-10-CM | POA: Insufficient documentation

## 2020-11-07 DIAGNOSIS — Z87891 Personal history of nicotine dependence: Secondary | ICD-10-CM

## 2020-11-07 DIAGNOSIS — C7972 Secondary malignant neoplasm of left adrenal gland: Secondary | ICD-10-CM

## 2020-11-07 DIAGNOSIS — R918 Other nonspecific abnormal finding of lung field: Secondary | ICD-10-CM

## 2020-11-07 DIAGNOSIS — C7971 Secondary malignant neoplasm of right adrenal gland: Secondary | ICD-10-CM

## 2020-11-07 DIAGNOSIS — C7800 Secondary malignant neoplasm of unspecified lung: Secondary | ICD-10-CM | POA: Insufficient documentation

## 2020-11-07 DIAGNOSIS — C7931 Secondary malignant neoplasm of brain: Secondary | ICD-10-CM | POA: Insufficient documentation

## 2020-11-07 DIAGNOSIS — C7970 Secondary malignant neoplasm of unspecified adrenal gland: Secondary | ICD-10-CM | POA: Insufficient documentation

## 2020-11-07 DIAGNOSIS — C642 Malignant neoplasm of left kidney, except renal pelvis: Secondary | ICD-10-CM | POA: Insufficient documentation

## 2020-11-07 DIAGNOSIS — Z5112 Encounter for antineoplastic immunotherapy: Secondary | ICD-10-CM | POA: Insufficient documentation

## 2020-11-07 DIAGNOSIS — Z7189 Other specified counseling: Secondary | ICD-10-CM | POA: Insufficient documentation

## 2020-11-07 MED ORDER — PROCHLORPERAZINE MALEATE 10 MG PO TABS: 10.0000 mg | ORAL_TABLET | Freq: Four times a day (QID) | ORAL | 0 refills | Status: AC | PRN

## 2020-11-07 NOTE — Progress Notes (Signed)
Reason for the request:   Kidney cancer  HPI: I was asked by Dr. Zada Finders to evaluate Kenneth Grant for the diagnosis of kidney cancer.  He is a 60 year old man presented on Oct 15, 2020 with a dizziness and syncopal episode while in prison.  He was evaluated in the emergency department at that time and found to have 1.3 cm lesion in the right frontal lobe.  He has subsequently was evaluated by neurosurgery as well as radiation oncology and underwent stereotactic radiosurgery followed by right craniotomy and tumor resection.  The final pathology showed metastatic carcinoma consistent with a renal cell primary.  CT scan chest abdomen and pelvis showed a large mass measuring 11.3 x 10.3 x 9.8 cm.  He has bilateral pulmonary nodules as as well as adrenal metastasis.  Since his discharge, he reports feeling reasonably well without any major complaints.  He denies any headaches or blurry vision.  He denies any residual neurological complaints.  On Oct 18 2020 he also had a biopsy percutaneously which confirmed the presence of clear-cell renal cell carcinoma.  He does not report any headaches, blurry vision, syncope or seizures. Does not report any fevers, chills or sweats.  Does not report any cough, wheezing or hemoptysis.  Does not report any chest pain, palpitation, orthopnea or leg edema.  Does not report any nausea, vomiting or abdominal pain.  Does not report any constipation or diarrhea.  Does not report any skeletal complaints.    Does not report frequency, urgency or hematuria.  Does not report any skin rashes or lesions. Does not report any heat or cold intolerance.  Does not report any lymphadenopathy or petechiae.  Does not report any anxiety or depression.  Remaining review of systems is negative.     Past Medical History:  Diagnosis Date   Cancer [facility])   :   Past Surgical History:  Procedure Laterality Date   APPLICATION OF CRANIAL NAVIGATION N/A 10/20/2020   Procedure: APPLICATION OF  CRANIAL NAVIGATION;  Surgeon: Judith Part, MD;  Location: [facility];  Service: Neurosurgery;  Laterality: N/A;   CRANIOTOMY Right 10/20/2020   Procedure: Right sided craniotomy for tumor resection;  Surgeon: Judith Part, MD;  Location: [facility];  Service: Neurosurgery;  Laterality: Right;  :   Current Outpatient Medications:    levETIRAcetam (KEPPRA) 500 MG tablet, Take 1 tablet (500 mg total) by mouth 2 (two) times daily., Disp: 60 tablet, Rfl: 5:   Allergies  Allergen Reactions   Penicillins Hives  :  No family history on file.:   Social History   Socioeconomic History   Marital status: Divorced    Spouse name: Not on file   Number of children: Not on file   Years of education: Not on file   Highest education level: Not on file  Occupational History   Not on file  Tobacco Use   Smoking status: Former    Pack years: 0.00   Smokeless tobacco: Current  Vaping Use   Vaping Use: Never used  Substance and Sexual Activity   Alcohol use: Yes    Comment: 12-20 mixed drinks and beer   Drug use: Yes    Types: Marijuana    Comment: weekly   Sexual activity: Not on file  Other Topics Concern   Not on file  Social History Narrative   Not on file   Social Determinants of Health   Financial Resource Strain: Not on file  Food Insecurity: Not on file  Transportation  Needs: Not on file  Physical Activity: Not on file  Stress: Not on file  Social Connections: Not on file  Intimate Partner Violence: Not on file  :  Pertinent items are noted in HPI.  Exam: Blood pressure 125/73, pulse 86, temperature 98 F (36.7 C), temperature source Oral, resp. rate 17, weight 193 lb 9.6 oz (87.8 kg), SpO2 98 %.  ECOG 1 General appearance: alert and cooperative appeared without distress. Head: atraumatic without any abnormalities. Eyes: conjunctivae/corneas clear. PERRL.  Sclera anicteric. Throat: lips, mucosa, and tongue normal; without oral thrush or ulcers. Resp: clear to  auscultation bilaterally without rhonchi, wheezes or dullness to percussion. Cardio: regular rate and rhythm, S1, S2 normal, no murmur, click, rub or gallop GI: soft, non-tender; bowel sounds normal; no masses,  no organomegaly Skin: Skin color, texture, turgor normal. No rashes or lesions Lymph nodes: Cervical, supraclavicular, and axillary nodes normal. Neurologic: Grossly normal without any motor, sensory or deep tendon reflexes. Musculoskeletal: No joint deformity or effusion.   Assessment and Plan:    60 year old man with:  1.  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  He has a CNS as well as pulmonary and adrenal metastasis.  He presented with intermediate risk category.  His disease status was updated at this time and treatment options were discussed.  Imaging studies including CT scan as well as MRI were personally reviewed.  These choices include oral targeted therapy, immunotherapy or combination of the above.  Given his aggressive disease and intermediate risk features the option between ipilimumab and nivolumab versus Pembrolizumab and axitinib remains the best choice.  I favor proceeding with ipilimumab and nivolumab as a more definitive option given the fact that he is asymptomatic and he has high-volume disease likely would benefit from long-term disease remission.  Complication associated with this therapy that include nausea, fatigue, autoimmune complications and dermatitis were discussed.  He is agreeable to proceed after education class.  2.  IV access: Peripheral veins will be in use at this time.  3.  Antiemetics: Compazine prescription will be available to him.  4.  Autoimmune complications: We will continue to monitor and educate him about these issues.  Pneumonitis, colitis and thyroid disease or potential complications.  5.  Goals of care: Treatment is palliative at this time although aggressive measures are  warranted given his excellent pulm status.  6.  Follow-up: We will be in the near future to start treatment.   60  minutes were dedicated to this visit. The time was spent on reviewing laboratory data, imaging studies, discussing treatment options,  and answering questions regarding future plan.     A copy of this consult has been forwarded to the requesting physician.

## 2020-11-07 NOTE — Progress Notes (Signed)
START ON PATHWAY REGIMEN - Renal Cell     A cycle is every 21 days:     Nivolumab      Ipilimumab    A cycle is every 28 days:     Nivolumab   **Always confirm dose/schedule in your pharmacy ordering system**  Patient Characteristics: Stage IV/Metastatic Disease, Clear Cell, First Line, Intermediate or Poor Risk Therapeutic Status: Stage IV/Metastatic Disease Histology: Clear Cell Line of Therapy: First Line Risk Status: Intermediate Risk Intent of Therapy: Non-Curative / Palliative Intent, Discussed with Patient 

## 2020-11-10 ENCOUNTER — Other Ambulatory Visit

## 2020-11-10 ENCOUNTER — Telehealth: Payer: Self-pay | Admitting: Oncology

## 2020-11-10 NOTE — Telephone Encounter (Signed)
Scheduled per 06/14 los, patient has been called regarding all future appointments. Left a voicemail.

## 2020-11-16 ENCOUNTER — Ambulatory Visit

## 2020-11-16 ENCOUNTER — Telehealth: Payer: Self-pay | Admitting: Oncology

## 2020-11-16 ENCOUNTER — Inpatient Hospital Stay

## 2020-11-16 NOTE — Telephone Encounter (Signed)
Called patient regarding 06/24 appointments, patient is notified.

## 2020-11-17 ENCOUNTER — Inpatient Hospital Stay

## 2020-11-17 ENCOUNTER — Encounter: Payer: Self-pay | Admitting: Oncology

## 2020-11-17 ENCOUNTER — Other Ambulatory Visit: Payer: Self-pay

## 2020-11-17 VITALS — BP 115/69 | HR 80 | Temp 98.0°F | Resp 17

## 2020-11-17 DIAGNOSIS — Z79899 Other long term (current) drug therapy: Secondary | ICD-10-CM | POA: Diagnosis not present

## 2020-11-17 DIAGNOSIS — Z5112 Encounter for antineoplastic immunotherapy: Secondary | ICD-10-CM | POA: Diagnosis not present

## 2020-11-17 DIAGNOSIS — C7970 Secondary malignant neoplasm of unspecified adrenal gland: Secondary | ICD-10-CM | POA: Diagnosis not present

## 2020-11-17 DIAGNOSIS — C642 Malignant neoplasm of left kidney, except renal pelvis: Secondary | ICD-10-CM

## 2020-11-17 DIAGNOSIS — C7800 Secondary malignant neoplasm of unspecified lung: Secondary | ICD-10-CM | POA: Diagnosis not present

## 2020-11-17 DIAGNOSIS — C7931 Secondary malignant neoplasm of brain: Secondary | ICD-10-CM | POA: Diagnosis not present

## 2020-11-17 LAB — CMP (CANCER CENTER ONLY)
ALT: 8 U/L (ref 0–44)
AST: 12 U/L — ABNORMAL LOW (ref 15–41)
Albumin: 3.4 g/dL — ABNORMAL LOW (ref 3.5–5.0)
Alkaline Phosphatase: 106 U/L (ref 38–126)
Anion gap: 9 (ref 5–15)
BUN: 9 mg/dL (ref 8–23)
CO2: 23 mmol/L (ref 22–32)
Calcium: 9.7 mg/dL (ref 8.9–10.3)
Chloride: 106 mmol/L (ref 98–111)
Creatinine: 0.81 mg/dL (ref 0.61–1.24)
GFR, Estimated: >60 mL/min (ref >60)
Glucose, Bld: 114 mg/dL — ABNORMAL HIGH (ref 70–99)
Potassium: 4.2 mmol/L (ref 3.5–5.1)
Sodium: 138 mmol/L (ref 135–145)
Total Bilirubin: 0.4 mg/dL (ref 0.3–1.2)
Total Protein: 7.1 g/dL (ref 6.5–8.1)

## 2020-11-17 LAB — CBC WITH DIFFERENTIAL (CANCER CENTER ONLY)
Abs Immature Granulocytes: 0.05 10*3/uL (ref 0.00–0.07)
Basophils Absolute: 0.1 10*3/uL (ref 0.0–0.1)
Basophils Relative: 1 %
Eosinophils Absolute: 0.2 10*3/uL (ref 0.0–0.5)
Eosinophils Relative: 2 %
HCT: 40.8 % (ref 39.0–52.0)
Hemoglobin: 13.7 g/dL (ref 13.0–17.0)
Immature Granulocytes: 1 %
Lymphocytes Relative: 23 %
Lymphs Abs: 1.9 10*3/uL (ref 0.7–4.0)
MCH: 27.8 pg (ref 26.0–34.0)
MCHC: 33.6 g/dL (ref 30.0–36.0)
MCV: 82.9 fL (ref 80.0–100.0)
Monocytes Absolute: 0.8 10*3/uL (ref 0.1–1.0)
Monocytes Relative: 9 %
Neutro Abs: 5.2 10*3/uL (ref 1.7–7.7)
Neutrophils Relative %: 64 %
Platelet Count: 248 10*3/uL (ref 150–400)
RBC: 4.92 MIL/uL (ref 4.22–5.81)
RDW: 13.8 % (ref 11.5–15.5)
WBC Count: 8.1 10*3/uL (ref 4.0–10.5)
nRBC: 0 % (ref 0.0–0.2)

## 2020-11-17 LAB — TSH: TSH: 2.929 u[IU]/mL (ref 0.320–4.118)

## 2020-11-17 MED ORDER — DIPHENHYDRAMINE HCL 50 MG/ML IJ SOLN
INTRAMUSCULAR | Status: AC
  Filled 2020-11-17: qty 1

## 2020-11-17 MED ORDER — SODIUM CHLORIDE 0.9 % IV SOLN
1.0000 mg/kg | Freq: Once | INTRAVENOUS | Status: AC
  Administered 2020-11-17: 90 mg via INTRAVENOUS
  Filled 2020-11-17: qty 18

## 2020-11-17 MED ORDER — SODIUM CHLORIDE 0.9 % IV SOLN
Freq: Once | INTRAVENOUS | Status: AC
  Filled 2020-11-17: qty 250

## 2020-11-17 MED ORDER — FAMOTIDINE 20 MG IN NS 100 ML IVPB
INTRAVENOUS | Status: AC
  Filled 2020-11-17: qty 100

## 2020-11-17 MED ORDER — FAMOTIDINE 20 MG IN NS 100 ML IVPB
20.0000 mg | Freq: Once | INTRAVENOUS | Status: AC
  Administered 2020-11-17: 20 mg via INTRAVENOUS

## 2020-11-17 MED ORDER — DIPHENHYDRAMINE HCL 50 MG/ML IJ SOLN
25.0000 mg | Freq: Once | INTRAMUSCULAR | Status: AC
  Administered 2020-11-17: 25 mg via INTRAVENOUS

## 2020-11-17 MED ORDER — SODIUM CHLORIDE 0.9 % IV SOLN
240.0000 mg | Freq: Once | INTRAVENOUS | Status: AC
  Administered 2020-11-17: 240 mg via INTRAVENOUS
  Filled 2020-11-17: qty 24

## 2020-11-17 NOTE — Patient Instructions (Addendum)
[facility] ONCOLOGY  Discharge Instructions: Thank you for choosing [facility] to provide your oncology and hematology care.   If you have a lab appointment with the [facility], please go directly to the [facility] and check in at the registration area.   Wear comfortable clothing and clothing appropriate for easy access to any Portacath or PICC line.   We strive to give you quality time with your provider. You may need to reschedule your appointment if you arrive late (15 or more minutes).  Arriving late affects you and other patients whose appointments are after yours.  Also, if you miss three or more appointments without notifying the office, you may be dismissed from the clinic at the provider's discretion.      For prescription refill requests, have your pharmacy contact our office and allow 72 hours for refills to be completed.    Today you received the following chemotherapy and/or immunotherapy agents [facility].      To help prevent nausea and vomiting after your treatment, we encourage you to take your nausea medication as directed.  BELOW ARE SYMPTOMS THAT SHOULD BE REPORTED IMMEDIATELY: *FEVER GREATER THAN 100.4 F (38 C) OR HIGHER *CHILLS OR SWEATING *NAUSEA AND VOMITING THAT IS NOT CONTROLLED WITH YOUR NAUSEA MEDICATION *UNUSUAL SHORTNESS OF BREATH *UNUSUAL BRUISING OR BLEEDING *URINARY PROBLEMS (pain or burning when urinating, or frequent urination) *BOWEL PROBLEMS (unusual diarrhea, constipation, pain near the anus) TENDERNESS IN MOUTH AND THROAT WITH OR WITHOUT PRESENCE OF ULCERS (sore throat, sores in mouth, or a toothache) UNUSUAL RASH, SWELLING OR PAIN  UNUSUAL VAGINAL DISCHARGE OR ITCHING   Items with * indicate a potential emergency and should be followed up as soon as possible or go to the Emergency Department if any problems should occur.  Please show the CHEMOTHERAPY ALERT CARD or IMMUNOTHERAPY ALERT CARD at  check-in to the Emergency Department and triage nurse.  Should you have questions after your visit or need to cancel or reschedule your appointment, please contact [facility]  Dept: [phone]  and follow the prompts.  Office hours are 8:00 a.m. to 4:30 p.m. Monday - Friday. Please note that voicemails left after 4:00 p.m. may not be returned until the following business day.  We are closed weekends and major holidays. You have access to a nurse at all times for urgent questions. Please call the main number to the clinic Dept: [phone] and follow the prompts.   For any non-urgent questions, you may also contact your provider using MyChart. We now offer e-Visits for anyone 68 and older to request care online for non-urgent symptoms. For details visit [email or web address].   Also download the MyChart app! Go to the app store, search "MyChart", open the app, select [facility], and log in with your MyChart username and password.  Due to Covid, a mask is required upon entering the hospital/clinic. If you do not have a mask, one will be given to you upon arrival. For doctor visits, patients may have 1 support person aged 1 or older with them. For treatment visits, patients cannot have anyone with them due to current Covid guidelines and our immunocompromised population.      Nivolumab injection What is this medication? NIVOLUMAB (nye VOL ue mab) is a monoclonal antibody. It treats certain types of cancer. Some of the cancers treated are colon cancer, head and neck cancer,Hodgkin lymphoma, lung cancer, and melanoma. This medicine may be used  for other purposes; ask your health care provider orpharmacist if you have questions. COMMON BRAND NAME(S): Opdivo What should I tell my care team before I take this medication? They need to know if you have any of these conditions: autoimmune diseases like Crohn's disease, ulcerative colitis, or lupus have had or planning  to have an allogeneic stem cell transplant (uses someone else's stem cells) history of chest radiation history of organ transplant nervous system problems like myasthenia gravis or Guillain-Barre syndrome an unusual or allergic reaction to nivolumab, other medicines, foods, dyes, or preservatives pregnant or trying to get pregnant breast-feeding How should I use this medication? This medicine is for infusion into a vein. It is given by a health careprofessional in a hospital or clinic setting. A special MedGuide will be given to you before each treatment. Be sure to readthis information carefully each time. Talk to your pediatrician regarding the use of this medicine in children. While this drug may be prescribed for children as young as 12 years for selectedconditions, precautions do apply. Overdosage: If you think you have taken too much of this medicine contact apoison control center or emergency room at once. NOTE: This medicine is only for you. Do not share this medicine with others. What if I miss a dose? It is important not to miss your dose. Call your doctor or health careprofessional if you are unable to keep an appointment. What may interact with this medication? Interactions have not been studied. This list may not describe all possible interactions. Give your health care provider a list of all the medicines, herbs, non-prescription drugs, or dietary supplements you use. Also tell them if you smoke, drink alcohol, or use illegaldrugs. Some items may interact with your medicine. What should I watch for while using this medication? This drug may make you feel generally unwell. Continue your course of treatmenteven though you feel ill unless your doctor tells you to stop. You may need blood work done while you are taking this medicine. Do not become pregnant while taking this medicine or for 5 months after stopping it. Women should inform their doctor if they wish to become pregnant or  think they might be pregnant. There is a potential for serious side effects to an unborn child. Talk to your health care professional or pharmacist for more information. Do not breast-feed an infant while taking this medicine orfor 5 months after stopping it. What side effects may I notice from receiving this medication? Side effects that you should report to your doctor or health care professionalas soon as possible: allergic reactions like skin rash, itching or hives, swelling of the face, lips, or tongue breathing problems blood in the urine bloody or watery diarrhea or black, tarry stools changes in emotions or moods changes in vision chest pain cough dizziness feeling faint or lightheaded, falls fever, chills headache with fever, neck stiffness, confusion, loss of memory, sensitivity to light, hallucination, loss of contact with reality, or seizures joint pain mouth sores redness, blistering, peeling or loosening of the skin, including inside the mouth severe muscle pain or weakness signs and symptoms of high blood sugar such as dizziness; dry mouth; dry skin; fruity breath; nausea; stomach pain; increased hunger or thirst; increased urination signs and symptoms of kidney injury like trouble passing urine or change in the amount of urine signs and symptoms of liver injury like dark yellow or brown urine; general ill feeling or flu-like symptoms; light-colored stools; loss of appetite; nausea; right upper belly pain; unusually  weak or tired; yellowing of the eyes or skin swelling of the ankles, feet, hands trouble passing urine or change in the amount of urine unusually weak or tired weight gain or loss Side effects that usually do not require medical attention (report to yourdoctor or health care professional if they continue or are bothersome): bone pain constipation decreased appetite diarrhea muscle pain nausea, vomiting tiredness This list may not describe all possible side  effects. Call your doctor for medical advice about side effects. You may report side effects to FDA at1-800-FDA-1088. Where should I keep my medication? This drug is given in a hospital or clinic and will not be stored at home. NOTE: This sheet is a summary. It may not cover all possible information. If you have questions about this medicine, talk to your doctor, pharmacist, orhealth care provider.  2022 Elsevier/Gold Standard (2019-09-15 10:08:25)  Ipilimumab injection What is this medication? IPILIMUMAB (IP i LIM ue mab) is a monoclonal antibody. It is used to treat colorectal cancer, kidney cancer, liver cancer, lung cancer, melanoma, andmesothelioma. This medicine may be used for other purposes; ask your health care provider orpharmacist if you have questions. COMMON BRAND NAME(S): YERVOY What should I tell my care team before I take this medication? They need to know if you have any of these conditions: autoimmune diseases like Crohn's disease, ulcerative colitis, or lupus have had or planning to have an allogeneic stem cell transplant (uses someone else's stem cells) history of organ transplant nervous system problems like myasthenia gravis or Guillain-Barre syndrome an unusual or allergic reaction to ipilimumab, other medicines, foods, dyes, or preservatives pregnant or trying to get pregnant breast-feeding How should I use this medication? This medicine is for infusion into a vein. It is given by a health careprofessional in a hospital or clinic setting. A special MedGuide will be given to you before each treatment. Be sure to readthis information carefully each time. Talk to your pediatrician regarding the use of this medicine in children. While this drug may be prescribed for children as young as 12 years for selectedconditions, precautions do apply. Overdosage: If you think you have taken too much of this medicine contact apoison control center or emergency room at once. NOTE:  This medicine is only for you. Do not share this medicine with others. What if I miss a dose? It is important not to miss your dose. Call your doctor or health careprofessional if you are unable to keep an appointment. What may interact with this medication? Interactions are not expected. This list may not describe all possible interactions. Give your health care provider a list of all the medicines, herbs, non-prescription drugs, or dietary supplements you use. Also tell them if you smoke, drink alcohol, or use illegaldrugs. Some items may interact with your medicine. What should I watch for while using this medication? Tell your doctor or healthcare professional if your symptoms do not start toget better or if they get worse. Do not become pregnant while taking this medicine or for 3 months after stopping it. Women should inform their doctor if they wish to become pregnant or think they might be pregnant. There is a potential for serious side effects to an unborn child. Talk to your health care professional or pharmacist for more information. Do not breast-feed an infant while taking this medicine orfor 3 months after the last dose. Your condition will be monitored carefully while you are receiving thismedicine. You may need blood work done while you are taking this  medicine. What side effects may I notice from receiving this medication? Side effects that you should report to your doctor or health care professionalas soon as possible: allergic reactions like skin rash, itching or hives, swelling of the face, lips, or tongue black, tarry stools bloody or watery diarrhea changes in vision dizziness eye pain fast, irregular heartbeat feeling anxious feeling faint or lightheaded, falls nausea, vomiting pain, tingling, numbness in the hands or feet redness, blistering, peeling or loosening of the skin, including inside the mouth signs and symptoms of liver injury like dark yellow or brown  urine; general ill feeling or flu-like symptoms; light-colored stools; loss of appetite; nausea; right upper belly pain; unusually weak or tired; yellowing of the eyes or skin unusual bleeding or bruising Side effects that usually do not require medical attention (report to yourdoctor or health care professional if they continue or are bothersome): headache loss of appetite trouble sleeping This list may not describe all possible side effects. Call your doctor for medical advice about side effects. You may report side effects to FDA at1-800-FDA-1088. Where should I keep my medication? This drug is given in a hospital or clinic and will not be stored at home. NOTE: This sheet is a summary. It may not cover all possible information. If you have questions about this medicine, talk to your doctor, pharmacist, orhealth care provider.  2022 Elsevier/Gold Standard (2019-04-14 18:53:00)

## 2020-11-17 NOTE — Progress Notes (Signed)
Met with patient at registration to introduce myself as Arboriculturist and to offer available resources.  Discussed one-time $1000 Radio broadcast assistant to assist with personal expenses while going through treatment.  Approved patient based on verbal statement and with standard letter of support. Discussed in detail expenses and how they are covered. He has a copy of the approval letter and expense sheet along with the Outpatient pharmacy information. He received 2 gift cards today from his grant.  He has my card for any additional financial questions or concerns.

## 2020-11-17 NOTE — Progress Notes (Signed)

## 2020-11-20 ENCOUNTER — Other Ambulatory Visit: Payer: Self-pay

## 2020-11-20 ENCOUNTER — Ambulatory Visit
Admission: RE | Admit: 2020-11-20 | Discharge: 2020-11-20 | Disposition: A | Discharge status: Home / Self Care | Source: Ambulatory Visit | Attending: Radiation Oncology | Admitting: Radiation Oncology

## 2020-11-20 DIAGNOSIS — C7931 Secondary malignant neoplasm of brain: Secondary | ICD-10-CM | POA: Insufficient documentation

## 2020-11-20 DIAGNOSIS — C642 Malignant neoplasm of left kidney, except renal pelvis: Secondary | ICD-10-CM | POA: Insufficient documentation

## 2020-11-20 DIAGNOSIS — C7949 Secondary malignant neoplasm of other parts of nervous system: Secondary | ICD-10-CM

## 2020-11-20 DIAGNOSIS — Z510 Encounter for antineoplastic radiation therapy: Secondary | ICD-10-CM | POA: Insufficient documentation

## 2020-11-21 ENCOUNTER — Encounter: Payer: Self-pay | Admitting: Oncology

## 2020-11-21 NOTE — Progress Notes (Signed)
Patient's contact person Belinda Petrenko(ex-spouse) called trying to assist with patient matters. She is concerned that he does not have a PCP and does not have insurance and is trying to assist with Medicaid application and she needs specific dates of admission and etc. I did advise her patient would need to complete a release of information for medical records to be released. Advised her patients must apply for Medicaid and be denied, before they can apply for charity care through [facility]. All uninsured patients automatically receive a 57% discount.  She is also concerned about him possibly needing speech therapy.   Sent secure chat to [employer] in social work to follow up.  She has my contact information for any additional financial questions or concerns.

## 2020-11-22 ENCOUNTER — Encounter: Payer: Self-pay | Admitting: Oncology

## 2020-11-22 ENCOUNTER — Encounter: Payer: Self-pay | Admitting: *Deleted

## 2020-11-22 NOTE — Progress Notes (Signed)
LCSW received referral to contact patient's ex-spouse. CSW left voicemail to return call.  Maryjean Morn, MSW, LCSW, OSW-C Clinical Social Worker [facility] [phone]

## 2020-11-29 ENCOUNTER — Telehealth: Payer: Self-pay | Admitting: Radiation Therapy

## 2020-11-29 ENCOUNTER — Other Ambulatory Visit: Payer: Self-pay | Admitting: Radiation Therapy

## 2020-11-29 DIAGNOSIS — C7931 Secondary malignant neoplasm of brain: Secondary | ICD-10-CM

## 2020-11-29 NOTE — Telephone Encounter (Signed)
Left a detailed message requesting Mr. Devins return my call. 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 Mr. Marsiglia is currently scheduled to meet with Dr. Alen Blew on Friday 7/15. I will go ahead and work on the three month post treatment brain MRI and visit with Dr. Ida Rogue PA, Shona Simpson. Hopefully these appointments will be set up before his visit with Dr. Alen Blew and will be included on the AVS, just in case he does not call back.   Mont Dutton R.T.(R)(T) Radiation Special Procedures Navigator

## 2020-11-30 NOTE — Progress Notes (Signed)
Kenneth Grant did return my call. He reports that he is doing much better since his radiation and surgery was completed. His strength has improved, he is able to walk better and grip his hand again. The only thing that is still a little off is his speech, but this is also improving. I was able to understand him very clearly during our telephone conversation.  Kenneth Grant is scheduled to meet with Dr. Alen Blew on 7/15 and the 3 mo follow-up brain MRI with follow-up in August have also been set up. Kenneth Grant has my contact information in case he has any questions or conflicts with the upcoming visits. He was thankful for the call and again, very please with his overall results from therapy.   Mont Dutton R.T.(R)(T) Radiation Special Procedures Navigator

## 2020-12-01 ENCOUNTER — Encounter: Payer: Self-pay | Admitting: Oncology

## 2020-12-08 ENCOUNTER — Inpatient Hospital Stay: Attending: Oncology

## 2020-12-08 ENCOUNTER — Other Ambulatory Visit: Payer: Self-pay

## 2020-12-08 ENCOUNTER — Inpatient Hospital Stay (HOSPITAL_BASED_OUTPATIENT_CLINIC_OR_DEPARTMENT_OTHER): Admitting: Oncology

## 2020-12-08 ENCOUNTER — Inpatient Hospital Stay

## 2020-12-08 VITALS — BP 137/69 | HR 86 | Temp 97.8°F | Resp 18 | Wt 184.1 lb

## 2020-12-08 DIAGNOSIS — C642 Malignant neoplasm of left kidney, except renal pelvis: Secondary | ICD-10-CM | POA: Diagnosis not present

## 2020-12-08 DIAGNOSIS — Z79899 Other long term (current) drug therapy: Secondary | ICD-10-CM | POA: Insufficient documentation

## 2020-12-08 DIAGNOSIS — C7949 Secondary malignant neoplasm of other parts of nervous system: Secondary | ICD-10-CM | POA: Diagnosis not present

## 2020-12-08 DIAGNOSIS — C7800 Secondary malignant neoplasm of unspecified lung: Secondary | ICD-10-CM | POA: Insufficient documentation

## 2020-12-08 DIAGNOSIS — Z5112 Encounter for antineoplastic immunotherapy: Secondary | ICD-10-CM | POA: Diagnosis not present

## 2020-12-08 DIAGNOSIS — C7970 Secondary malignant neoplasm of unspecified adrenal gland: Secondary | ICD-10-CM | POA: Diagnosis not present

## 2020-12-08 LAB — TSH: TSH: 1.531 u[IU]/mL (ref 0.320–4.118)

## 2020-12-08 LAB — CMP (CANCER CENTER ONLY)
ALT: 8 U/L (ref 0–44)
AST: 16 U/L (ref 15–41)
Albumin: 3.5 g/dL (ref 3.5–5.0)
Alkaline Phosphatase: 107 U/L (ref 38–126)
Anion gap: 7 (ref 5–15)
BUN: 13 mg/dL (ref 8–23)
CO2: 23 mmol/L (ref 22–32)
Calcium: 10.6 mg/dL — ABNORMAL HIGH (ref 8.9–10.3)
Chloride: 105 mmol/L (ref 98–111)
Creatinine: 0.83 mg/dL (ref 0.61–1.24)
GFR, Estimated: >60 mL/min (ref >60)
Glucose, Bld: 102 mg/dL — ABNORMAL HIGH (ref 70–99)
Potassium: 4.6 mmol/L (ref 3.5–5.1)
Sodium: 135 mmol/L (ref 135–145)
Total Bilirubin: 0.4 mg/dL (ref 0.3–1.2)
Total Protein: 7.5 g/dL (ref 6.5–8.1)

## 2020-12-08 LAB — CBC WITH DIFFERENTIAL (CANCER CENTER ONLY)
Abs Immature Granulocytes: 0.04 10*3/uL (ref 0.00–0.07)
Basophils Absolute: 0.1 10*3/uL (ref 0.0–0.1)
Basophils Relative: 1 %
Eosinophils Absolute: 0.1 10*3/uL (ref 0.0–0.5)
Eosinophils Relative: 1 %
HCT: 41.5 % (ref 39.0–52.0)
Hemoglobin: 14 g/dL (ref 13.0–17.0)
Immature Granulocytes: 0 %
Lymphocytes Relative: 24 %
Lymphs Abs: 2.2 10*3/uL (ref 0.7–4.0)
MCH: 27.2 pg (ref 26.0–34.0)
MCHC: 33.7 g/dL (ref 30.0–36.0)
MCV: 80.7 fL (ref 80.0–100.0)
Monocytes Absolute: 0.8 10*3/uL (ref 0.1–1.0)
Monocytes Relative: 9 %
Neutro Abs: 6.1 10*3/uL (ref 1.7–7.7)
Neutrophils Relative %: 65 %
Platelet Count: 258 10*3/uL (ref 150–400)
RBC: 5.14 MIL/uL (ref 4.22–5.81)
RDW: 13 % (ref 11.5–15.5)
WBC Count: 9.3 10*3/uL (ref 4.0–10.5)
nRBC: 0 % (ref 0.0–0.2)

## 2020-12-08 MED ORDER — DIPHENHYDRAMINE HCL 50 MG/ML IJ SOLN
25.0000 mg | Freq: Once | INTRAMUSCULAR | Status: AC
  Administered 2020-12-08: 25 mg via INTRAVENOUS

## 2020-12-08 MED ORDER — SODIUM CHLORIDE 0.9 % IV SOLN
1.0000 mg/kg | Freq: Once | INTRAVENOUS | Status: AC
  Administered 2020-12-08: 90 mg via INTRAVENOUS
  Filled 2020-12-08: qty 18

## 2020-12-08 MED ORDER — DIPHENHYDRAMINE HCL 50 MG/ML IJ SOLN
INTRAMUSCULAR | Status: AC
  Filled 2020-12-08: qty 1

## 2020-12-08 MED ORDER — SODIUM CHLORIDE 0.9 % IV SOLN
Freq: Once | INTRAVENOUS | Status: AC
  Filled 2020-12-08: qty 250

## 2020-12-08 MED ORDER — SODIUM CHLORIDE 0.9 % IV SOLN
2.7300 mg/kg | Freq: Once | INTRAVENOUS | Status: AC
  Administered 2020-12-08: 240 mg via INTRAVENOUS
  Filled 2020-12-08: qty 24

## 2020-12-08 MED ORDER — FAMOTIDINE 20 MG IN NS 100 ML IVPB
20.0000 mg | Freq: Once | INTRAVENOUS | Status: AC
Start: 2020-12-08 — End: 2020-12-08
  Administered 2020-12-08: 20 mg via INTRAVENOUS

## 2020-12-08 MED ORDER — FAMOTIDINE 20 MG IN NS 100 ML IVPB
INTRAVENOUS | Status: AC
  Filled 2020-12-08: qty 100

## 2020-12-08 NOTE — Progress Notes (Signed)
Hematology and Oncology Follow Up Visit  Kenneth Grant [id number] 07-19-1960 60 y.o. 12/08/2020 12:39 PM Pcp, NoCurcio, Roselie Awkward, NP   Principle Diagnosis: He is is 60 year old with stage IV kidney cancer presented with large left kidney mass with pulmonary, adrenal and CNS metastasis.  This is biopsy-proven to be clear-cell subtype.   Prior Therapy:  He underwent stereotactic radiosurgery followed by right craniotomy and tumor resection.  The final pathology showed metastatic carcinoma consistent with a renal cell primary on Oct 22, 2020.  Current therapy: Ipilimumab 1 mg/kg with nivolumab 3 mg/kg started on November 17, 2020.  He is here for cycle 2 of therapy.  Interim History: Mr. Kenneth Grant presents today for a follow-up visit.  Since the last encounter, he completed the first cycle of immunotherapy without any major complications.  He did report some mild fatigue and tiredness but has resolved at this time.  He denies any nausea, vomiting or abdominal pain.  He denies any shortness of breath or difficulty breathing.  His performance status quality of life remained excellent.     Medications: I have reviewed the patient's current medications.  Current Outpatient Medications  Medication Sig Dispense Refill   levETIRAcetam (KEPPRA) 500 MG tablet Take 1 tablet (500 mg total) by mouth 2 (two) times daily. 60 tablet 5   prochlorperazine (COMPAZINE) 10 MG tablet Take 1 tablet (10 mg total) by mouth every 6 (six) hours as needed for nausea or vomiting. 30 tablet 0   No current facility-administered medications for this visit.     Allergies:  Allergies  Allergen Reactions   Penicillins Hives        Physical Exam: Blood pressure 137/69, pulse 86, temperature 97.8 F (36.6 C), temperature source Oral, resp. rate 18, weight 184 lb 1.6 oz (83.5 kg), SpO2 98 %.  ECOG:     General appearance: Comfortable appearing without any discomfort Head: Normocephalic without any  trauma Oropharynx: Mucous membranes are moist and pink without any thrush or ulcers. Eyes: Pupils are equal and round reactive to light. Lymph nodes: No cervical, supraclavicular, inguinal or axillary lymphadenopathy.   Heart:regular rate and rhythm.  S1 and S2 without leg edema. Lung: Clear without any rhonchi or wheezes.  No dullness to percussion. Abdomin: Soft, nontender, nondistended with good bowel sounds.  No hepatosplenomegaly. Musculoskeletal: No joint deformity or effusion.  Full range of motion noted. Neurological: No deficits noted on motor, sensory and deep tendon reflex exam. Skin: No petechial rash or dryness.  Appeared moist.      Lab Results: Lab Results  Component Value Date   WBC 8.1 11/17/2020   HGB 13.7 11/17/2020   HCT 40.8 11/17/2020   MCV 82.9 11/17/2020   PLT 248 11/17/2020     Chemistry      Component Value Date/Time   NA 138 11/17/2020 0936   K 4.2 11/17/2020 0936   CL 106 11/17/2020 0936   CO2 23 11/17/2020 0936   BUN 9 11/17/2020 0936   CREATININE 0.81 11/17/2020 0936      Component Value Date/Time   CALCIUM 9.7 11/17/2020 0936   ALKPHOS 106 11/17/2020 0936   AST 12 (L) 11/17/2020 0936   ALT 8 11/17/2020 0936   BILITOT 0.4 11/17/2020 0936          Impression and Plan:  60 year old man with:  1.  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  He was found to have pulmonary, adrenal and CNS involvement.     He is  currently receiving therapy with ipilimumab and nivolumab and completed the first cycle without any complications.  Risks and benefits of proceeding with cycle 2 were discussed.  Complications including immune mediated issues, GI toxicities, dermatological issues were reviewed.  The plan is to update his staging scans after cycle 4 and proceeding with nivolumab maintenance after.  He is agreeable to proceed.   2.  IV access: Port-A-Cath option has been deferred at this time and peripheral veins are currently  in use.   3.  Antiemetics: No nausea or vomiting reported.  Compazine is available to him.   4.  Autoimmune complications: These complications including pneumonitis, colitis, hypophysitis and hepatitis among others continue to be addressed.  5.  Goals of care: Aggressive measures are warranted given his excellent performance status.  His disease is unlikely to be curable however.   6.  Follow-up: In 3 weeks for the next cycle of therapy.     30  minutes were spent on this encounter.  The time was dedicated to reviewing his disease status, addressing complications related to cancer, cancer therapy and future plan of care discussion.      Zola Button, MD 7/15/202212:39 PM

## 2020-12-08 NOTE — Patient Instructions (Signed)
[facility]  Discharge Instructions: Thank you for choosing [facility] to provide your oncology and hematology care.   If you have a lab appointment with the Cancer Center, please go directly to the Cancer Center and check in at the registration area.   Wear comfortable clothing and clothing appropriate for easy access to any Portacath or PICC line.   We strive to give you quality time with your provider. You may need to reschedule your appointment if you arrive late (15 or more minutes).  Arriving late affects you and other patients whose appointments are after yours.  Also, if you miss three or more appointments without notifying the office, you may be dismissed from the clinic at the provider's discretion.      For prescription refill requests, have your pharmacy contact our office and allow 72 hours for refills to be completed.    Today you received the following chemotherapy and/or immunotherapy agents : Opdivo, Yervoy      To help prevent nausea and vomiting after your treatment, we encourage you to take your nausea medication as directed.  BELOW ARE SYMPTOMS THAT SHOULD BE REPORTED IMMEDIATELY: *FEVER GREATER THAN 100.4 F (38 C) OR HIGHER *CHILLS OR SWEATING *NAUSEA AND VOMITING THAT IS NOT CONTROLLED WITH YOUR NAUSEA MEDICATION *UNUSUAL SHORTNESS OF BREATH *UNUSUAL BRUISING OR BLEEDING *URINARY PROBLEMS (pain or burning when urinating, or frequent urination) *BOWEL PROBLEMS (unusual diarrhea, constipation, pain near the anus) TENDERNESS IN MOUTH AND THROAT WITH OR WITHOUT PRESENCE OF ULCERS (sore throat, sores in mouth, or a toothache) UNUSUAL RASH, SWELLING OR PAIN  UNUSUAL VAGINAL DISCHARGE OR ITCHING   Items with * indicate a potential emergency and should be followed up as soon as possible or go to the Emergency Department if any problems should occur.  Please show the CHEMOTHERAPY ALERT CARD or IMMUNOTHERAPY ALERT CARD at  check-in to the Emergency Department and triage nurse.  Should you have questions after your visit or need to cancel or reschedule your appointment, please contact [facility]  Dept: [phone]  and follow the prompts.  Office hours are 8:00 a.m. to 4:30 p.m. Monday - Friday. Please note that voicemails left after 4:00 p.m. may not be returned until the following business day.  We are closed weekends and major holidays. You have access to a nurse at all times for urgent questions. Please call the main number to the clinic Dept: [phone] and follow the prompts.   For any non-urgent questions, you may also contact your provider using MyChart. We now offer e-Visits for anyone 18 and older to request care online for non-urgent symptoms. For details visit [email or web address].   Also download the MyChart app! Go to the app store, search "MyChart", open the app, select [facility], and log in with your MyChart username and password.  Due to Covid, a mask is required upon entering the hospital/clinic. If you do not have a mask, one will be given to you upon arrival. For doctor visits, patients may have 1 support person aged 18 or older with them. For treatment visits, patients cannot have anyone with them due to current Covid guidelines and our immunocompromised population.   

## 2020-12-08 NOTE — Progress Notes (Signed)
Ipilimumab (YERVOY) Patient Monitoring Assessment   Is the patient experiencing any of the following general symptoms?:  []Difficulty performing normal activities []Feeling sluggish or cold all the time []Unusual weight gain []Constant or unusual headaches []Feeling dizzy or faint []Changes in eyesight (blurry vision, double vision, or other vision problems) []Changes in mood or behavior (ex: decreased sex drive, irritability, or forgetfulness) []Starting new medications (ex: steroids, other medications that lower immune response) [x]Patient is not experiencing any of the general symptoms above.    Gastrointestinal  Patient is having 1 bowel movements each day.  Is this different from baseline? []Yes [x]No Are your stools watery or do they have a foul smell? []Yes [x]No Have you seen blood in your stools? []Yes [x]No Are your stools dark, tarry, or sticky? []Yes [x]No Are you having pain or tenderness in your belly? []Yes [x]No  Skin Does your skin itch? []Yes [x]No Do you have a rash? []Yes [x]No Has your skin blistered and/or peeled? []Yes [x]No Do you have sores in your mouth? []Yes [x]No  Hepatic Has your urine been dark or tea colored? []Yes [x]No Have you noticed that your skin or the whites of your eyes are turning yellow? []Yes [x]No Are you bleeding or bruising more easily than normal? []Yes [x]No Are you nauseous and/or vomiting? []Yes [x]No Do you have pain on the right side of your stomach? []Yes [x]No  Neurologic  Are you having unusual weakness of legs, arms, or face? []Yes [x]No Are you having numbness or tingling in your hands or feet? []Yes [x]No  Kenneth Grant  

## 2020-12-13 ENCOUNTER — Encounter: Payer: Self-pay | Admitting: Oncology

## 2020-12-25 ENCOUNTER — Telehealth: Payer: Self-pay | Admitting: Oncology

## 2020-12-25 NOTE — Telephone Encounter (Signed)
Scheduled per 07/15 los, patient has been called and voicemail.

## 2020-12-29 ENCOUNTER — Inpatient Hospital Stay: Payer: Self-pay

## 2020-12-29 ENCOUNTER — Inpatient Hospital Stay: Payer: Self-pay | Attending: Oncology

## 2020-12-29 ENCOUNTER — Other Ambulatory Visit: Payer: Self-pay

## 2020-12-29 ENCOUNTER — Inpatient Hospital Stay (HOSPITAL_BASED_OUTPATIENT_CLINIC_OR_DEPARTMENT_OTHER): Admitting: Physician Assistant

## 2020-12-29 VITALS — BP 128/71 | HR 88 | Temp 97.7°F | Resp 18 | Wt 184.5 lb

## 2020-12-29 DIAGNOSIS — Z79899 Other long term (current) drug therapy: Secondary | ICD-10-CM | POA: Insufficient documentation

## 2020-12-29 DIAGNOSIS — C642 Malignant neoplasm of left kidney, except renal pelvis: Secondary | ICD-10-CM

## 2020-12-29 DIAGNOSIS — C7800 Secondary malignant neoplasm of unspecified lung: Secondary | ICD-10-CM | POA: Insufficient documentation

## 2020-12-29 DIAGNOSIS — Z5112 Encounter for antineoplastic immunotherapy: Secondary | ICD-10-CM | POA: Insufficient documentation

## 2020-12-29 DIAGNOSIS — C7970 Secondary malignant neoplasm of unspecified adrenal gland: Secondary | ICD-10-CM | POA: Insufficient documentation

## 2020-12-29 DIAGNOSIS — C7931 Secondary malignant neoplasm of brain: Secondary | ICD-10-CM | POA: Insufficient documentation

## 2020-12-29 LAB — CMP (CANCER CENTER ONLY)
ALT: 9 U/L (ref 0–44)
AST: 12 U/L — ABNORMAL LOW (ref 15–41)
Albumin: 3.2 g/dL — ABNORMAL LOW (ref 3.5–5.0)
Alkaline Phosphatase: 107 U/L (ref 38–126)
Anion gap: 10 (ref 5–15)
BUN: 10 mg/dL (ref 8–23)
CO2: 21 mmol/L — ABNORMAL LOW (ref 22–32)
Calcium: 9.6 mg/dL (ref 8.9–10.3)
Chloride: 107 mmol/L (ref 98–111)
Creatinine: 0.89 mg/dL (ref 0.61–1.24)
GFR, Estimated: >60 mL/min (ref >60)
Glucose, Bld: 198 mg/dL — ABNORMAL HIGH (ref 70–99)
Potassium: 3.8 mmol/L (ref 3.5–5.1)
Sodium: 138 mmol/L (ref 135–145)
Total Bilirubin: 0.3 mg/dL (ref 0.3–1.2)
Total Protein: 6.7 g/dL (ref 6.5–8.1)

## 2020-12-29 LAB — CBC WITH DIFFERENTIAL (CANCER CENTER ONLY)
Abs Immature Granulocytes: 0.04 10*3/uL (ref 0.00–0.07)
Basophils Absolute: 0.1 10*3/uL (ref 0.0–0.1)
Basophils Relative: 1 %
Eosinophils Absolute: 0.3 10*3/uL (ref 0.0–0.5)
Eosinophils Relative: 3 %
HCT: 39.6 % (ref 39.0–52.0)
Hemoglobin: 13.2 g/dL (ref 13.0–17.0)
Immature Granulocytes: 0 %
Lymphocytes Relative: 17 %
Lymphs Abs: 1.6 10*3/uL (ref 0.7–4.0)
MCH: 26.8 pg (ref 26.0–34.0)
MCHC: 33.3 g/dL (ref 30.0–36.0)
MCV: 80.3 fL (ref 80.0–100.0)
Monocytes Absolute: 0.6 10*3/uL (ref 0.1–1.0)
Monocytes Relative: 6 %
Neutro Abs: 6.9 10*3/uL (ref 1.7–7.7)
Neutrophils Relative %: 73 %
Platelet Count: 232 10*3/uL (ref 150–400)
RBC: 4.93 MIL/uL (ref 4.22–5.81)
RDW: 13 % (ref 11.5–15.5)
WBC Count: 9.5 10*3/uL (ref 4.0–10.5)
nRBC: 0 % (ref 0.0–0.2)

## 2020-12-29 LAB — TSH: TSH: 2.354 u[IU]/mL (ref 0.320–4.118)

## 2020-12-29 MED ORDER — DIPHENHYDRAMINE HCL 50 MG/ML IJ SOLN
INTRAMUSCULAR | Status: AC
  Filled 2020-12-29: qty 1

## 2020-12-29 MED ORDER — FAMOTIDINE 20 MG IN NS 100 ML IVPB
20.0000 mg | Freq: Once | INTRAVENOUS | Status: AC
Start: 2020-12-29 — End: 2020-12-29
  Administered 2020-12-29: 20 mg via INTRAVENOUS

## 2020-12-29 MED ORDER — DIPHENHYDRAMINE HCL 50 MG/ML IJ SOLN
25.0000 mg | Freq: Once | INTRAMUSCULAR | Status: AC
  Administered 2020-12-29: 25 mg via INTRAVENOUS

## 2020-12-29 MED ORDER — SODIUM CHLORIDE 0.9 % IV SOLN
Freq: Once | INTRAVENOUS | Status: AC
  Filled 2020-12-29: qty 250

## 2020-12-29 MED ORDER — FAMOTIDINE 20 MG IN NS 100 ML IVPB
INTRAVENOUS | Status: AC
  Filled 2020-12-29: qty 100

## 2020-12-29 MED ORDER — SODIUM CHLORIDE 0.9 % IV SOLN
2.7300 mg/kg | Freq: Once | INTRAVENOUS | Status: AC
  Administered 2020-12-29: 240 mg via INTRAVENOUS
  Filled 2020-12-29: qty 24

## 2020-12-29 MED ORDER — SODIUM CHLORIDE 0.9 % IV SOLN
1.0000 mg/kg | Freq: Once | INTRAVENOUS | Status: AC
  Administered 2020-12-29: 90 mg via INTRAVENOUS
  Filled 2020-12-29: qty 18

## 2020-12-29 NOTE — Patient Instructions (Signed)
[facility]COL[facility]  Discharge Instructions: Thank you for choosing [facility] to provide your oncology and hematology care.   If you have a lab appointment with the [facility], please go directly to the [facility] and check in at the registration area.   Wear comfortable clothing and clothing appropriate for easy access to any Portacath or PICC line.   We strive to give you quality time with your provider. You may need to reschedule your appointment if you arrive late (15 or more minutes).  Arriving late affects you and other patients whose appointments are after yours.  Also, if you miss three or more appointments without notifying the office, you may be dismissed from the clinic at the provider's discretion.      For prescription refill requests, have your pharmacy contact our office and allow 72 hours for refills to be completed.    Today you received the following chemotherapy and/or immunotherapy agents: Nivolumab (Opdivo), and Yervoy.   To help prevent nausea and vomiting after your treatment, we encourage you to take your nausea medication as directed.  BELOW ARE SYMPTOMS THAT SHOULD BE REPORTED IMMEDIATELY: *FEVER GREATER THAN 100.4 F (38 C) OR HIGHER *CHILLS OR SWEATING *NAUSEA AND VOMITING THAT IS NOT CONTROLLED WITH YOUR NAUSEA MEDICATION *UNUSUAL SHORTNESS OF BREATH *UNUSUAL BRUISING OR BLEEDING *URINARY PROBLEMS (pain or burning when urinating, or frequent urination) *BOWEL PROBLEMS (unusual diarrhea, constipation, pain near the anus) TENDERNESS IN MOUTH AND THROAT WITH OR WITHOUT PRESENCE OF ULCERS (sore throat, sores in mouth, or a toothache) UNUSUAL RASH, SWELLING OR PAIN  UNUSUAL VAGINAL DISCHARGE OR ITCHING   Items with * indicate a potential emergency and should be followed up as soon as possible or go to the Emergency Department if any problems should occur.  Please show the CHEMOTHERAPY ALERT CARD or IMMUNOTHERAPY ALERT  CARD at check-in to the Emergency Department and triage nurse.  Should you have questions after your visit or need to cancel or reschedule your appointment, please contact [facility]  Dept: [phone]  and follow the prompts.  Office hours are 8:00 a.m. to 4:30 p.m. Monday - Friday. Please note that voicemails left after 4:00 p.m. may not be returned until the following business day.  We are closed weekends and major holidays. You have access to a nurse at all times for urgent questions. Please call the main number to the clinic Dept: [phone] and follow the prompts.   For any non-urgent questions, you may also contact your provider using MyChart. We now offer e-Visits for anyone 67 and older to request care online for non-urgent symptoms. For details visit [email or web address].   Also download the MyChart app! Go to the app store, search "MyChart", open the app, select [facility], and log in with your MyChart username and password.  Due to Covid, a mask is required upon entering the hospital/clinic. If you do not have a mask, one will be given to you upon arrival. For doctor visits, patients may have 1 support person aged 70 or older with them. For treatment visits, patients cannot have anyone with them due to current Covid guidelines and our immunocompromised population.

## 2020-12-29 NOTE — Progress Notes (Signed)
Ipilimumab (YERVOY) Patient Monitoring Assessment   Is the patient experiencing any of the following general symptoms?:  []Difficulty performing normal activities []Feeling sluggish or cold all the time []Unusual weight gain []Constant or unusual headaches []Feeling dizzy or faint []Changes in eyesight (blurry vision, double vision, or other vision problems) []Changes in mood or behavior (ex: decreased sex drive, irritability, or forgetfulness) []Starting new medications (ex: steroids, other medications that lower immune response) [x]Patient is not experiencing any of the general symptoms above.    Gastrointestinal  Patient is having 1 bowel movements each day.  Is this different from baseline? []Yes [x]No Are your stools watery or do they have a foul smell? []Yes [x]No Have you seen blood in your stools? []Yes [x]No Are your stools dark, tarry, or sticky? []Yes [x]No Are you having pain or tenderness in your belly? []Yes [x]No  Skin Does your skin itch? []Yes [x]No Do you have a rash? []Yes [x]No Has your skin blistered and/or peeled? []Yes [x]No Do you have sores in your mouth? []Yes [x]No  Hepatic Has your urine been dark or tea colored? []Yes [x]No Have you noticed that your skin or the whites of your eyes are turning yellow? []Yes [x]No Are you bleeding or bruising more easily than normal? []Yes [x]No Are you nauseous and/or vomiting? []Yes [x]No Do you have pain on the right side of your stomach? []Yes [x]No  Neurologic  Are you having unusual weakness of legs, arms, or face? []Yes [x]No Are you having numbness or tingling in your hands or feet? []Yes [x]No  Kenneth Grant  

## 2020-12-29 NOTE — Progress Notes (Signed)
Hematology and Oncology Follow Up Visit  Kenneth Grant [id number] 11-Dec-1960 60 y.o. 12/29/2020 10:32 AM Pcp, Hoyle Sauer, MD   Principle Diagnosis: He is is 60 year old with stage IV kidney cancer presented with large left kidney mass with pulmonary, adrenal and CNS metastasis.  This is biopsy-proven to be clear-cell subtype.  Prior Therapy: He underwent stereotactic radiosurgery followed by right craniotomy and tumor resection.  The final pathology showed metastatic carcinoma consistent with a renal cell primary on Oct 22, 2020.  Current therapy: Ipilimumab 1 mg/kg with nivolumab 3 mg/kg started on November 17, 2020.  He is here for cycle 3 of therapy.  Interim History: Kenneth Grant presents today for a follow-up visit.  Since the last encounter, he completed the first cycle of immunotherapy without any major complications.  He did report persistent fatigue for 24-48 hours following the immunotherapy infusion. He is sedentary during that period but energy levels improve afterwards. He has a good appetite with stable weight.  He denies any nausea, vomiting or abdominal pain. His bowel movements are regular without any diarrhea or constipation. He denies easy bruising or signs of bleeding. He denies any fevers, chills, night sweats, shortness of breath, chest pain or cough. He has no other complaints.    Medications: I have reviewed the patient's current medications.  Current Outpatient Medications  Medication Sig Dispense Refill   levETIRAcetam (KEPPRA) 500 MG tablet Take 1 tablet (500 mg total) by mouth 2 (two) times daily. 60 tablet 5   prochlorperazine (COMPAZINE) 10 MG tablet Take 1 tablet (10 mg total) by mouth every 6 (six) hours as needed for nausea or vomiting. 30 tablet 0   No current facility-administered medications for this visit.     Allergies:  Allergies  Allergen Reactions   Penicillins Hives    Physical Exam: Blood pressure 128/71, pulse 88, temperature 97.7 F (36.5  C), temperature source Oral, resp. rate 18, weight 184 lb 8 oz (83.7 kg), SpO2 97 %.  ECOG: 1 General appearance: Comfortable appearing without any discomfort Head: Normocephalic without any trauma Oropharynx: Mucous membranes are moist and pink without any thrush or ulcers. Eyes: Pupils are equal and round reactive to light. Lymph nodes: No cervical, supraclavicular, inguinal or axillary lymphadenopathy.   Heart:regular rate and rhythm.  S1 and S2 without leg edema. Lung: Clear without any rhonchi or wheezes.  No dullness to percussion. Abdomin: Soft, nontender, nondistended with good bowel sounds.  No hepatosplenomegaly. Musculoskeletal: No joint deformity or effusion.  Full range of motion noted. Neurological: No deficits noted on motor, sensory and deep tendon reflex exam. Skin: No petechial rash or dryness.  Appeared moist.      Lab Results: Lab Results  Component Value Date   WBC 9.3 12/08/2020   HGB 14.0 12/08/2020   HCT 41.5 12/08/2020   MCV 80.7 12/08/2020   PLT 258 12/08/2020     Chemistry      Component Value Date/Time   NA 135 12/08/2020 1302   K 4.6 12/08/2020 1302   CL 105 12/08/2020 1302   CO2 23 12/08/2020 1302   BUN 13 12/08/2020 1302   CREATININE 0.83 12/08/2020 1302      Component Value Date/Time   CALCIUM 10.6 (H) 12/08/2020 1302   ALKPHOS 107 12/08/2020 1302   AST 16 12/08/2020 1302   ALT 8 12/08/2020 1302   BILITOT 0.4 12/08/2020 1302          Impression and Plan:  60 year old man with:  1.  Stage IV  clear-cell renal cell carcinoma diagnosed in May 2022.  He was found to have pulmonary, adrenal and CNS involvement.     He is currently receiving therapy with ipilimumab and nivolumab. Patient is due for cycle 3 today. Labs from today were reviewed without any intervention needed. He is tolerating treatment well except for fatigue lasting 24-48 hours. Patient will proceed with treatment today as planned and return in 3 weeks for Cycle 4. He  is scheduled for repeat MRI brain before next visit.    2.  IV access: Port-A-Cath option has been deferred at this time and peripheral veins are currently in use.   3.  Antiemetics: No nausea or vomiting reported.  Compazine is available to him.   4.  Autoimmune complications: These complications including pneumonitis, colitis, hypophysitis and hepatitis among others continue to be addressed.  5.  Goals of care: Aggressive measures are warranted given his excellent performance status.  His disease is unlikely to be curable however.   6.  Follow-up: In 3 weeks for the next cycle of therapy.  Patient expressed understanding and satisfaction with the plan provided.   I have spent a total of 25 minutes minutes of face-to-face and non-face-to-face time, preparing to see the patient, obtaining and/or reviewing separately obtained history, performing a medically appropriate examination, counseling and educating the patient, documenting clinical information in the electronic health record, and care coordination.   Lincoln Brigham, PA-C Hematology and [facility] [place] at [facility]      Lincoln Brigham, MD 8/5/202210:32 AM

## 2021-01-02 ENCOUNTER — Encounter: Payer: Self-pay | Admitting: Oncology

## 2021-01-15 ENCOUNTER — Telehealth: Payer: Self-pay | Admitting: *Deleted

## 2021-01-15 NOTE — Telephone Encounter (Signed)
Patient called reporting severe headache for past 3 days.  He reports balance issues that has started with this headache.  He states it unbearable and needs something to help get relief.  He states he has tried OTC medications Excedrin/tylenol/motrin all without success.  Patient is scheduled for MRI of brain this Thursday which would help give details of why headache.    Denies sensitivity to light/sensitivity to sound.    Routing to Dr Alen Blew and Dr Lisbeth Renshaw to see if patient could get something prescribed today before his follow up.

## 2021-01-17 ENCOUNTER — Telehealth: Payer: Self-pay | Admitting: Radiation Oncology

## 2021-01-17 NOTE — Telephone Encounter (Signed)
I called the patient and he is having headaches and multiple falls. He has tried goodies powders without relief of his headaches and feels quite dizzy. He is scheduled for a repeat MRI of his brain and we've avoided steroids given his ongoing immunotherapy. I will reach out as well since it doesn't appear that he's met with Dr. Mickeal Skinner not only for guidance of treating his symptoms once we know results of his MRI tomorrow but also for plans of long term follow up.

## 2021-01-18 ENCOUNTER — Other Ambulatory Visit: Payer: Self-pay

## 2021-01-18 ENCOUNTER — Ambulatory Visit (HOSPITAL_COMMUNITY)
Admission: RE | Admit: 2021-01-18 | Discharge: 2021-01-18 | Disposition: A | Discharge status: Home / Self Care | Payer: Self-pay | Source: Ambulatory Visit | Attending: Radiation Oncology | Admitting: Radiation Oncology

## 2021-01-18 DIAGNOSIS — C7931 Secondary malignant neoplasm of brain: Secondary | ICD-10-CM | POA: Insufficient documentation

## 2021-01-18 IMAGING — MR MR HEAD WO/W CM
8 of 13 series · 18 of 48 positions shown · IV contrast (gadavist)
Comparison: [DATE]

CLINICAL DATA: Follow-up metastatic renal cell carcinoma to the
brain

EXAM:
MRI HEAD WITHOUT AND WITH CONTRAST
TECHNIQUE: Multiplanar, multiecho pulse sequences of the brain and surrounding
structures were obtained without and with intravenous contrast.
CONTRAST:  8mL GADAVIST GADOBUTROL 1 MMOL/ML IV SOLN

[Series 2: FLAIR · sagittal · 3.0mm · 0.49mm/px · 1 of 42 slices shown (1 of 2)]
[im 1/42]
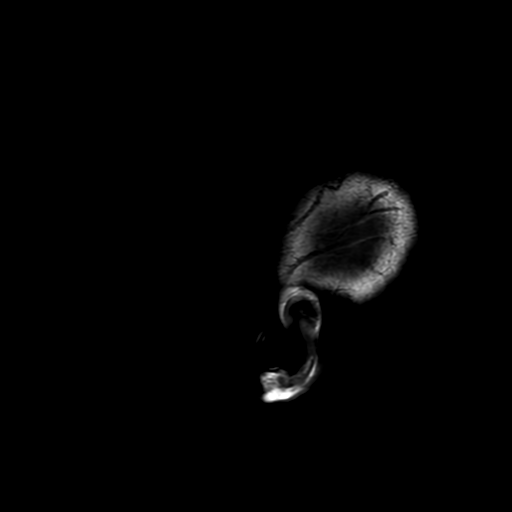

[Series 3: DWI · axial · 3.0mm · 0.94mm/px · z∈[-20,+147]mm · 2 of 114 slices shown]
[im 1/114]
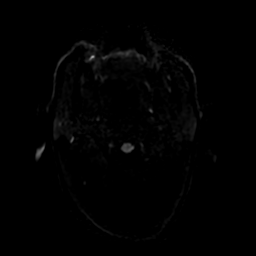
[im 114/114]
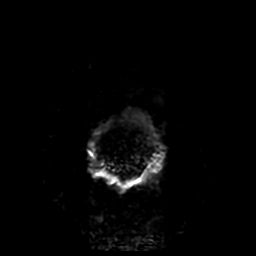

[Series 4: FLAIR · axial · 3.0mm · 0.49mm/px · z∈[-59,+130]mm · 2 of 64 slices shown (2 of 2)]
[im 1/64]
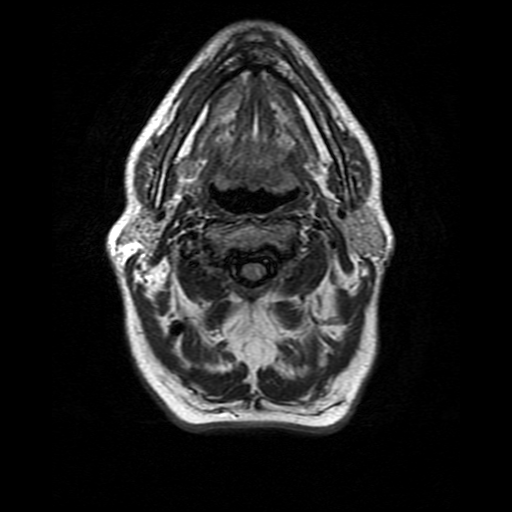
[im 64/64]
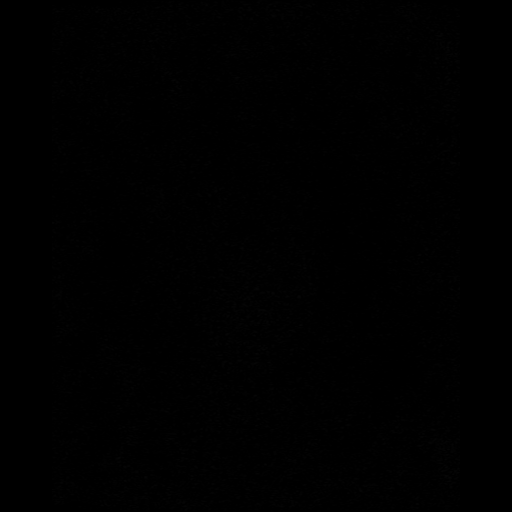

[Series 7: T2 post-contrast · coronal · 3.0mm · 0.39mm/px · 1 of 45 slices shown]
[im 1/45]
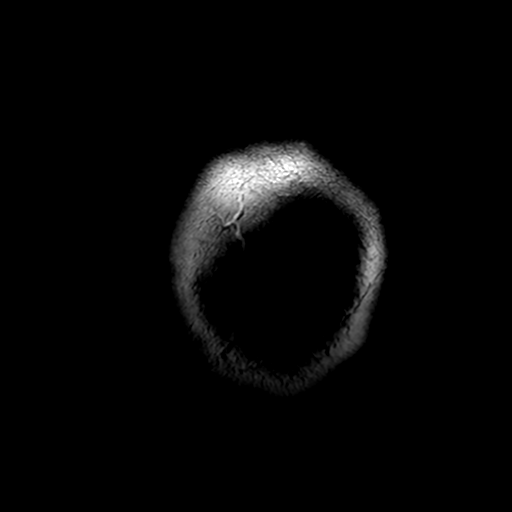

[Series 9: T1 post-contrast · coronal · 3.0mm · 0.43mm/px · 1 of 45 slices shown (1 of 2)]
[im 1/45]
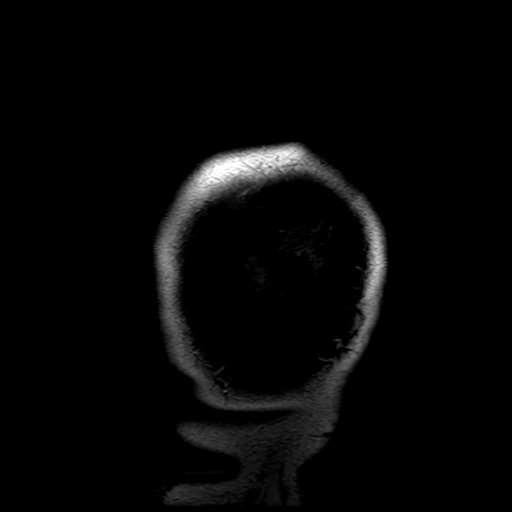

[Series 10: FLAIR post-contrast · sagittal · 3.0mm · 0.49mm/px · 1 of 42 slices shown]
[im 1/42]
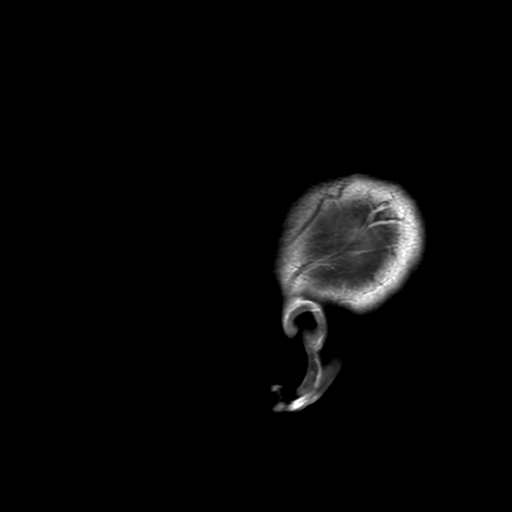

[Series 350: ADC · axial · 3.0mm · 0.94mm/px · z∈[-20,+147]mm · 2 of 58 slices shown]
[im 1/58]
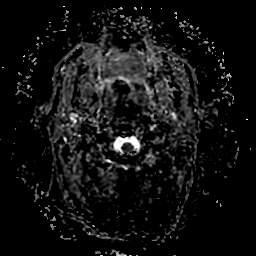
[im 58/58]
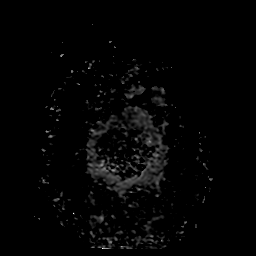

[Series 1100: T1 post-contrast · axial · 0.9mm · 0.50mm/px · z∈[-110,+146]mm · 8 of 302 slices shown (2 of 2)]
[im 1/302]
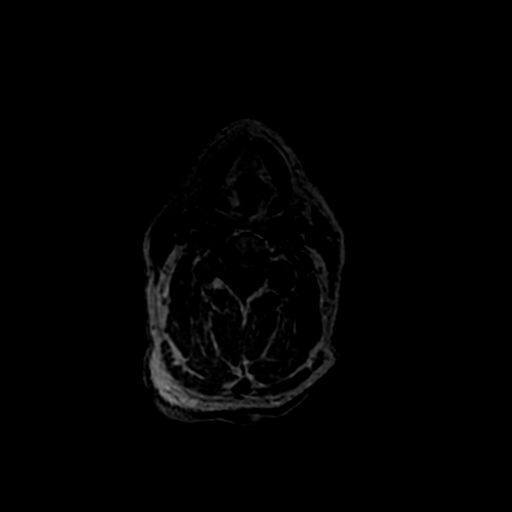
[im 44/302]
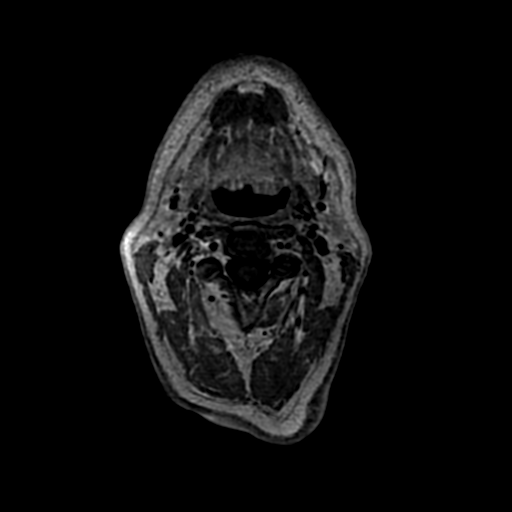
[im 87/302]
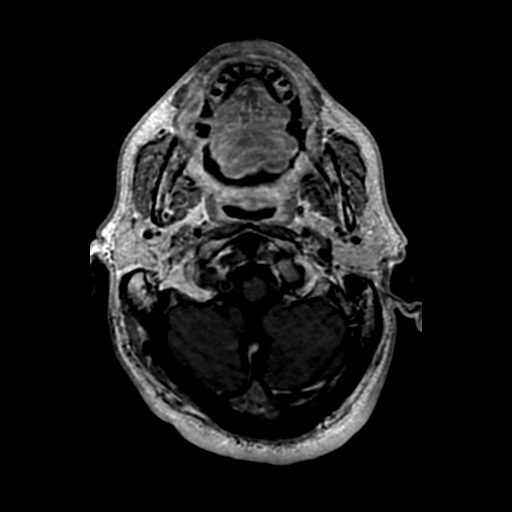
[im 130/302]
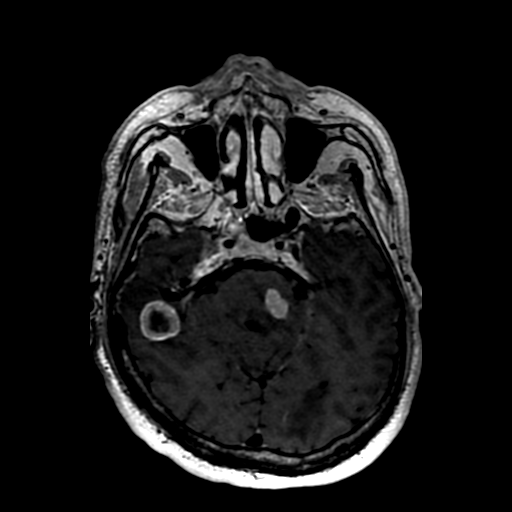
[im 173/302]
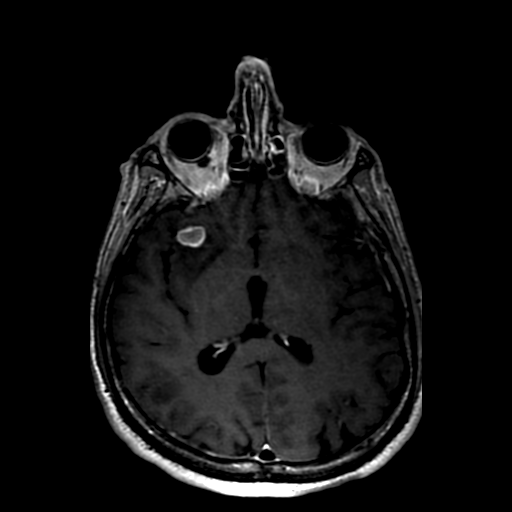
[im 216/302]
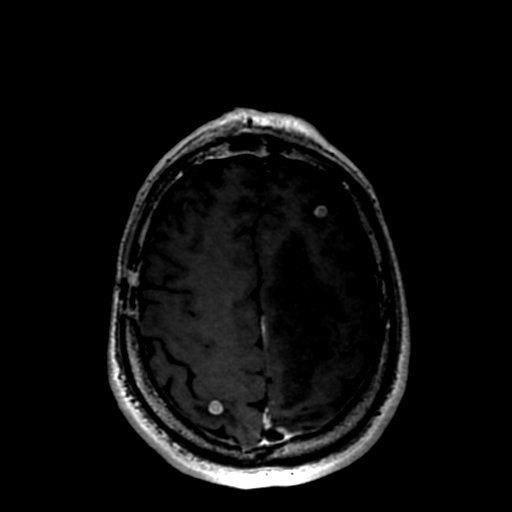
[im 259/302]
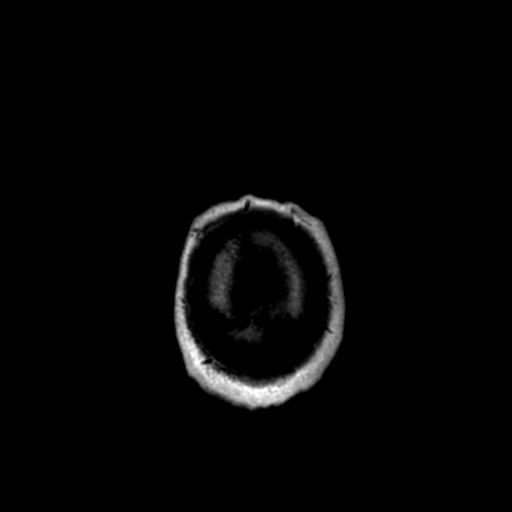
[im 302/302]
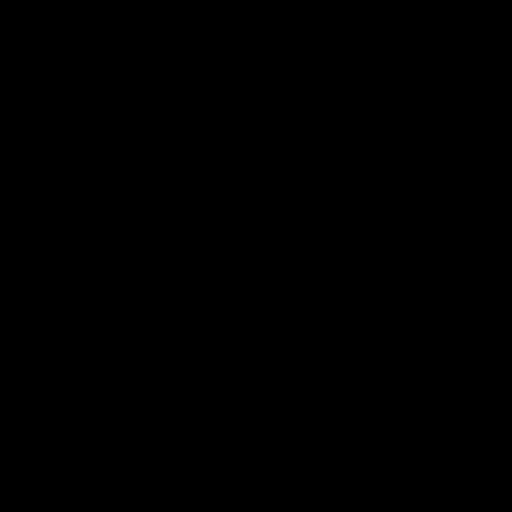

[18 of 48 positions shown; findings below may reference images not displayed]

FINDINGS: BRAIN

New Lesions:

1. 4 mm lesion in the left occipital pole on [DATE]
2. punctate metastasis in the inferior right temporal lobe on
3. Ring-enhancing 7 mm metastasis in the anterior left temporal lobe
on image 136
4. Punctate right superior cerebellar lesion on image 135
5. Ring-enhancing left brainstem lesion measuring 16 mm on image
138.
6. Ring-enhancing 22 mm lesion in the inferior right temporal lobe
on image 129
7. 2 cm left occipital lesion on image 143 with regional sulcal
enhancement which may reflect local subarachnoid growth.
8. 15 mm in the right temporal lobe on image 151
9. Punctate in the right occipital parietal region on image 165
10. 14 mm in the inferior right frontal lobe on image 177
11. 3 mm lesion in the parasagittal left occipital parietal region
on image 189
12. 3 mm lesion in the parasagittal left occipital parietal region
on image 189
13. 5 mm lesion in the right parietal cortex on image 192
14. 14 mm in the parasagittal left parietal lobe on image 196
15. 12 mm in the parasagittal left parietal lobe on image 207
16. 4 mm in the lateral right frontal lobe on image 211
17. 6 mm in the right anterior and high parietal region on image 215
18. 7 mm in the anterior left frontal lobe on image 214
19. Punctate in the left frontal white matter on image 219
20. Punctate in the parasagittal juxtacortical right frontal lobe on
image 225
21. 23 mm in the high left frontal parietal junction on image 235
22. 11 mm in the high left frontal lobe on image 246

Some of these lesions show signs of mild hemorrhagic character,
correlating with history of renal cell origin. There is exuberant
brain edema similar to the treated right frontal lobe lesion. Left
midbrain edema is particularly notable for its distortion on the
brainstem.

Stable or Smaller lesions:

The treated lateral right frontal lobe lesion has regressed in size
with chronic blood products

Other Brain findings: No superimposed infarct, hydrocephalus, or
collection.

Vascular: Normal flow voids.

Skull and upper cervical spine: Unremarkable right-sided craniotomy

Sinuses/Orbits: No acute or worrisome finding
IMPRESSION: 1. Marked progression with 22 new brain metastases and extensive
associated vasogenic edema.
2. Regression of the treated right frontal lobe metastasis.

## 2021-01-18 MED ORDER — GADOBUTROL 1 MMOL/ML IV SOLN
8.0000 mL | Freq: Once | INTRAVENOUS | Status: AC | PRN
  Administered 2021-01-18: 8 mL via INTRAVENOUS

## 2021-01-19 ENCOUNTER — Inpatient Hospital Stay (HOSPITAL_BASED_OUTPATIENT_CLINIC_OR_DEPARTMENT_OTHER): Payer: Self-pay | Admitting: Oncology

## 2021-01-19 ENCOUNTER — Inpatient Hospital Stay: Payer: Self-pay

## 2021-01-19 ENCOUNTER — Other Ambulatory Visit: Payer: Self-pay | Admitting: Radiation Oncology

## 2021-01-19 ENCOUNTER — Telehealth: Payer: Self-pay | Admitting: Radiation Therapy

## 2021-01-19 VITALS — BP 109/62 | HR 97 | Temp 98.7°F | Resp 18 | Ht 72.0 in | Wt 182.1 lb

## 2021-01-19 VITALS — BP 110/81 | HR 99 | Resp 18

## 2021-01-19 DIAGNOSIS — C642 Malignant neoplasm of left kidney, except renal pelvis: Secondary | ICD-10-CM

## 2021-01-19 LAB — CBC WITH DIFFERENTIAL (CANCER CENTER ONLY)
Abs Immature Granulocytes: 0.05 10*3/uL (ref 0.00–0.07)
Basophils Absolute: 0.1 10*3/uL (ref 0.0–0.1)
Basophils Relative: 1 %
Eosinophils Absolute: 0.1 10*3/uL (ref 0.0–0.5)
Eosinophils Relative: 1 %
HCT: 39.4 % (ref 39.0–52.0)
Hemoglobin: 13.5 g/dL (ref 13.0–17.0)
Immature Granulocytes: 1 %
Lymphocytes Relative: 16 %
Lymphs Abs: 1.7 10*3/uL (ref 0.7–4.0)
MCH: 26.4 pg (ref 26.0–34.0)
MCHC: 34.3 g/dL (ref 30.0–36.0)
MCV: 77.1 fL — ABNORMAL LOW (ref 80.0–100.0)
Monocytes Absolute: 0.7 10*3/uL (ref 0.1–1.0)
Monocytes Relative: 7 %
Neutro Abs: 7.7 10*3/uL (ref 1.7–7.7)
Neutrophils Relative %: 74 %
Platelet Count: 220 10*3/uL (ref 150–400)
RBC: 5.11 MIL/uL (ref 4.22–5.81)
RDW: 14 % (ref 11.5–15.5)
WBC Count: 10.4 10*3/uL (ref 4.0–10.5)
nRBC: 0 % (ref 0.0–0.2)

## 2021-01-19 LAB — CMP (CANCER CENTER ONLY)
ALT: 14 U/L (ref 0–44)
AST: 17 U/L (ref 15–41)
Albumin: 3.6 g/dL (ref 3.5–5.0)
Alkaline Phosphatase: 102 U/L (ref 38–126)
Anion gap: 7 (ref 5–15)
BUN: 14 mg/dL (ref 8–23)
CO2: 24 mmol/L (ref 22–32)
Calcium: 10.3 mg/dL (ref 8.9–10.3)
Chloride: 104 mmol/L (ref 98–111)
Creatinine: 0.84 mg/dL (ref 0.61–1.24)
GFR, Estimated: >60 mL/min (ref >60)
Glucose, Bld: 107 mg/dL — ABNORMAL HIGH (ref 70–99)
Potassium: 4.4 mmol/L (ref 3.5–5.1)
Sodium: 135 mmol/L (ref 135–145)
Total Bilirubin: 0.4 mg/dL (ref 0.3–1.2)
Total Protein: 7.1 g/dL (ref 6.5–8.1)

## 2021-01-19 LAB — TSH: TSH: 2.285 u[IU]/mL (ref 0.320–4.118)

## 2021-01-19 MED ORDER — DIPHENHYDRAMINE HCL 50 MG/ML IJ SOLN
25.0000 mg | Freq: Once | INTRAMUSCULAR | Status: AC
  Administered 2021-01-19: 25 mg via INTRAVENOUS
  Filled 2021-01-19: qty 1

## 2021-01-19 MED ORDER — DEXAMETHASONE 4 MG PO TABS: 4.0000 mg | ORAL_TABLET | Freq: Two times a day (BID) | ORAL | 0 refills | Status: AC

## 2021-01-19 MED ORDER — SODIUM CHLORIDE 0.9 % IV SOLN
1.0000 mg/kg | Freq: Once | INTRAVENOUS | Status: AC
  Administered 2021-01-19: 90 mg via INTRAVENOUS
  Filled 2021-01-19: qty 18

## 2021-01-19 MED ORDER — SODIUM CHLORIDE 0.9 % IV SOLN: Freq: Once | INTRAVENOUS | Status: AC

## 2021-01-19 MED ORDER — FAMOTIDINE 20 MG IN NS 100 ML IVPB
20.0000 mg | Freq: Once | INTRAVENOUS | Status: AC
  Administered 2021-01-19: 20 mg via INTRAVENOUS
  Filled 2021-01-19: qty 100

## 2021-01-19 MED ORDER — SODIUM CHLORIDE 0.9 % IV SOLN
2.7300 mg/kg | Freq: Once | INTRAVENOUS | Status: AC
  Administered 2021-01-19: 240 mg via INTRAVENOUS
  Filled 2021-01-19: qty 24

## 2021-01-19 NOTE — Telephone Encounter (Signed)
Spoke with Kenneth Grant informing him that Dr. Lisbeth Renshaw has called him in a prescription for steroids for him to pick up and start taking this evening. Mr. Taing expressed understanding and plans to start as instructed.   Mont Dutton R.T.(R)(T)

## 2021-01-19 NOTE — Progress Notes (Signed)
Ipilimumab (YERVOY) Patient Monitoring Assessment   Is the patient experiencing any of the following general symptoms?:  '[x]'$ Difficulty performing normal activities '[]'$ Feeling sluggish or cold all the time '[]'$ Unusual weight gain '[]'$ Constant or unusual headaches '[x]'$ Feeling dizzy or faint '[]'$ Changes in eyesight (blurry vision, double vision, or other vision problems) '[]'$ Changes in mood or behavior (ex: decreased sex drive, irritability, or forgetfulness) '[]'$ Starting new medications (ex: steroids, other medications that lower immune response) '[]'$ Patient is not experiencing any of the general symptoms above.    Gastrointestinal  Patient is having 2 bowel movements each day.  Is this different from baseline? '[]'$ Yes '[x]'$ No Are your stools watery or do they have a foul smell? '[x]'$ Yes '[]'$ No Have you seen blood in your stools? '[]'$ Yes '[x]'$ No Are your stools dark, tarry, or sticky? '[]'$ Yes '[x]'$ No Are you having pain or tenderness in your belly? '[]'$ Yes '[x]'$ No  Skin Does your skin itch? '[]'$ Yes '[x]'$ No Do you have a rash? '[]'$ Yes '[x]'$ No Has your skin blistered and/or peeled? '[]'$ Yes '[x]'$ No Do you have sores in your mouth? '[]'$ Yes '[x]'$ No  Hepatic Has your urine been dark or tea colored? '[]'$ Yes '[x]'$ No Have you noticed that your skin or the whites of your eyes are turning yellow? '[]'$ Yes '[x]'$ No Are you bleeding or bruising more easily than normal? '[]'$ Yes '[x]'$ No Are you nauseous and/or vomiting? '[]'$ Yes '[x]'$ No Do you have pain on the right side of your stomach? '[]'$ Yes '[x]'$ No  Neurologic  Are you having unusual weakness of legs, arms, or face? '[]'$ Yes '[x]'$ No Are you having numbness or tingling in your hands or feet? '[]'$ Yes '[x]'$ No   MD aware of dizziness.  Rennis Harding

## 2021-01-19 NOTE — Progress Notes (Signed)
After patient's treatment, he reported that he needed to use the restroom. This RN suggested patient wait until a wheelchair could be provided due to patient being dizzy before attempting to go to the bathroom. While RN went to get wheelchair, patient ambulated to the bathroom against this RN's advice. This RN met patient at the bathroom and upon exiting restroom, patient was visibly dizzy and supporting himself with the wall. RN assisted patient to wheelchair and assessed vitals. Vitals were stable and patient stated that he wanted to go home instead of waiting to be further assessed. Charge RN, Lexine Baton, made aware and patient was assisted to his ride via wheelchair. Patient was encouraged to call infusion clinic or seek emergency care for further issues.

## 2021-01-19 NOTE — Progress Notes (Signed)
Hematology and Oncology Follow Up Visit  Kenneth Grant [id number] 01/27/1961 60 y.o. 01/19/2021 8:46 AM Pcp, Hoyle Sauer, MD   Principle Diagnosis:  60 year old man with kidney cancer diagnosed in May 2022.  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   Prior Therapy:  He underwent stereotactic radiosurgery followed by right craniotomy and tumor resection.  The final pathology showed metastatic carcinoma consistent with a renal cell primary on Oct 22, 2020.  Current therapy: Ipilimumab 1 mg/kg with nivolumab 3 mg/kg started on November 17, 2020.  He is here for cycle 4 of therapy.  Interim History: Kenneth Grant is here for return evaluation.  Since the last visit, he reports no major changes in his health.  He has reported some occasional dizziness and lightheadedness but no syncope or falls.  He does report occasional headaches as well.  He denies any neurological symptoms including weakness or peripheral neuropathy.  His performance status quality of life not dramatically changed.  His appetite is reasonable.  He denies any shortness of breath or changes in his bowel habits.     Medications: Updated on review. Current Outpatient Medications  Medication Sig Dispense Refill   levETIRAcetam (KEPPRA) 500 MG tablet Take 1 tablet (500 mg total) by mouth 2 (two) times daily. 60 tablet 5   prochlorperazine (COMPAZINE) 10 MG tablet Take 1 tablet (10 mg total) by mouth every 6 (six) hours as needed for nausea or vomiting. 30 tablet 0   No current facility-administered medications for this visit.     Allergies:  Allergies  Allergen Reactions   Penicillins Hives        Physical Exam: Blood pressure 109/62, pulse 97, temperature 98.7 F (37.1 C), temperature source Oral, resp. rate 18, height 6' (1.829 m), weight 182 lb 1.6 oz (82.6 kg), SpO2 98 %.   ECOG: 1   General appearance: Alert, awake without any distress. Head:  Atraumatic without abnormalities Oropharynx: Without any thrush or ulcers. Eyes: No scleral icterus. Lymph nodes: No lymphadenopathy noted in the cervical, supraclavicular, or axillary nodes Heart:regular rate and rhythm, without any murmurs or gallops.   Lung: Clear to auscultation without any rhonchi, wheezes or dullness to percussion. Abdomin: Soft, nontender without any shifting dullness or ascites. Musculoskeletal: No clubbing or cyanosis. Neurological: No deficits noted on exam. Skin: No rashes or lesions.      Lab Results: Lab Results  Component Value Date   WBC 9.5 12/29/2020   HGB 13.2 12/29/2020   HCT 39.6 12/29/2020   MCV 80.3 12/29/2020   PLT 232 12/29/2020     Chemistry      Component Value Date/Time   NA 138 12/29/2020 1011   K 3.8 12/29/2020 1011   CL 107 12/29/2020 1011   CO2 21 (L) 12/29/2020 1011   BUN 10 12/29/2020 1011   CREATININE 0.89 12/29/2020 1011      Component Value Date/Time   CALCIUM 9.6 12/29/2020 1011   ALKPHOS 107 12/29/2020 1011   AST 12 (L) 12/29/2020 1011   ALT 9 12/29/2020 1011   BILITOT 0.3 12/29/2020 1011          Impression and Plan:  60 year old man with:  1.  Kidney cancer diagnosed in May 2022.  He presented with stage IV clear-cell with CNS disease.   Risks and benefits of continuing current treatment were discussed at this time.  I recommended proceeding with cycle 4 of therapy and to update his staging scans subsequently.  Maintenance nivolumab versus switching to a different agent could be considered at that time.  Oral targeted therapy with cabozantinib could be an option for salvage purposes.  He is agreeable to proceed today and we will tentatively start nivolumab maintenance in 3 weeks.   2.  IV access: Peripheral veins are currently in use without any issues at this time.   3.  Antiemetics: Compazine is available to him without any nausea or vomiting.   4.  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  5.  Goals of care: Therapy is palliative although aggressive measures are warranted at this time.  6.  CNS metastasis: MRI on January 18, 2021 which completed.  Results are currently pending and will have follow-up with Dr. Mickeal Skinner and his case will be discussed at the tumor board.  He is experiencing dizziness and lightheadedness which unclear etiology at this time.   7.  Follow-up: will return in 3 weeks for evaluation and possible nivolumab maintenance.     30  minutes were dedicated to this visit.  Time was spent on reviewing laboratory data, disease status update, treatment choices and future plan of care discussion.      Zola Button, MD 8/26/20228:46 AM

## 2021-01-19 NOTE — Patient Instructions (Signed)
[facility]  Discharge Instructions: Thank you for choosing [facility] to provide your oncology and hematology care.   If you have a lab appointment with the [facility], please go directly to the [facility] and check in at the registration area.   Wear comfortable clothing and clothing appropriate for easy access to any Portacath or PICC line.   We strive to give you quality time with your provider. You may need to reschedule your appointment if you arrive late (15 or more minutes).  Arriving late affects you and other patients whose appointments are after yours.  Also, if you miss three or more appointments without notifying the office, you may be dismissed from the clinic at the provider's discretion.      For prescription refill requests, have your pharmacy contact our office and allow 72 hours for refills to be completed.    Today you received the following chemotherapy and/or immunotherapy agents: Nivolumab (Opdivo), and Yervoy.   To help prevent nausea and vomiting after your treatment, we encourage you to take your nausea medication as directed.  BELOW ARE SYMPTOMS THAT SHOULD BE REPORTED IMMEDIATELY: *FEVER GREATER THAN 100.4 F (38 C) OR HIGHER *CHILLS OR SWEATING *NAUSEA AND VOMITING THAT IS NOT CONTROLLED WITH YOUR NAUSEA MEDICATION *UNUSUAL SHORTNESS OF BREATH *UNUSUAL BRUISING OR BLEEDING *URINARY PROBLEMS (pain or burning when urinating, or frequent urination) *BOWEL PROBLEMS (unusual diarrhea, constipation, pain near the anus) TENDERNESS IN MOUTH AND THROAT WITH OR WITHOUT PRESENCE OF ULCERS (sore throat, sores in mouth, or a toothache) UNUSUAL RASH, SWELLING OR PAIN  UNUSUAL VAGINAL DISCHARGE OR ITCHING   Items with * indicate a potential emergency and should be followed up as soon as possible or go to the Emergency Department if any problems should occur.  Please show the CHEMOTHERAPY ALERT CARD or IMMUNOTHERAPY ALERT  CARD at check-in to the Emergency Department and triage nurse.  Should you have questions after your visit or need to cancel or reschedule your appointment, please contact [facility]  Dept: [phone]  and follow the prompts.  Office hours are 8:00 a.m. to 4:30 p.m. Monday - Friday. Please note that voicemails left after 4:00 p.m. may not be returned until the following business day.  We are closed weekends and major holidays. You have access to a nurse at all times for urgent questions. Please call the main number to the clinic Dept: [phone] and follow the prompts.   For any non-urgent questions, you may also contact your provider using MyChart. We now offer e-Visits for anyone 38 and older to request care online for non-urgent symptoms. For details visit [email or web address].   Also download the MyChart app! Go to the app store, search "MyChart", open the app, select [facility], and log in with your MyChart username and password.  Due to Covid, a mask is required upon entering the hospital/clinic. If you do not have a mask, one will be given to you upon arrival. For doctor visits, patients may have 1 support person aged 67 or older with them. For treatment visits, patients cannot have anyone with them due to current Covid guidelines and our immunocompromised population.

## 2021-01-22 ENCOUNTER — Inpatient Hospital Stay: Payer: Self-pay

## 2021-01-22 ENCOUNTER — Telehealth: Payer: Self-pay | Admitting: *Deleted

## 2021-01-22 ENCOUNTER — Ambulatory Visit
Admission: RE | Admit: 2021-01-22 | Discharge: 2021-01-22 | Disposition: A | Discharge status: Home / Self Care | Payer: Self-pay | Source: Ambulatory Visit | Attending: Radiation Oncology | Admitting: Radiation Oncology

## 2021-01-22 ENCOUNTER — Encounter: Payer: Self-pay | Admitting: Radiation Oncology

## 2021-01-22 ENCOUNTER — Telehealth: Payer: Self-pay

## 2021-01-22 DIAGNOSIS — C642 Malignant neoplasm of left kidney, except renal pelvis: Secondary | ICD-10-CM

## 2021-01-22 DIAGNOSIS — G9389 Other specified disorders of brain: Secondary | ICD-10-CM

## 2021-01-22 NOTE — Progress Notes (Signed)
Radiation Oncology         [phone] ________________________________ Outpatient Follow Up - Conducted via telephone due to current COVID-19 concerns for limiting patient exposure  I spoke with the patient to conduct this consult visit via telephone to spare the patient unnecessary potential exposure in the healthcare setting during the current COVID-19 pandemic. The patient was notified in advance and was offered a [place] meeting to allow for face to face communication but unfortunately reported that they did not have the appropriate resources/technology to support such a visit and instead preferred to proceed with a telephone visit  Name: Kenneth Grant        MRN: [id number]  Date of Service: 01/22/2021 DOB: 1960-09-28  CC:Pcp, No  Kenneth Part, MD     REFERRING PHYSICIAN: Judith Part, MD   DIAGNOSIS: The primary encounter diagnosis was Renal cell carcinoma of left kidney ([place]). A diagnosis of Mass of right frontal lobe was also pertinent to this visit.   HISTORY OF PRESENT ILLNESS: Kenneth Grant is a 60 y.o. male with a history of Stage IV renal cell carcinoma who originally presented in the spring of 2022 with a solitary mass in the right frontal lobe. He received preoperative SRS and subsequent resection on 10/20/20 with Dr. Zada Grant. He's done well to that site since with MRI on 10/21/20 showing no active disease but vascular changes with small infarcts in the right parietal lobe. He has been on dual immunotherapy with Dr. Alen Grant since 11/17/20. His last infusion was on 01/19/21. He called with complaints of nausea and significant dizziness. He was counseled on the need to get brain imaging and MRI on 01/19/21 showed 22 new brain metastases with extensive vasogenic edema. The largest lesion was 2.3 cm in the high left frontal parietal junction, and notably as well a right temporal lobe lesion at 2.2 cm, a 2 cm left occipital lesion, and sulcal enhancement with possible  subarachnoid growth, and a 16 mm lesion in the left brainstem. He was started on dexamethasone 4 mg BID on 01/19/21. He's contacted by phone to discuss these MRI results.     PREVIOUS RADIATION THERAPY: 10/19/2020 through 10/19/2020 Preoperative SRS Site Technique Total Dose (Gy) Dose per Fx (Gy) Completed Fx Beam Energies  Brain: PTV_1 Rt Frontal 39m IMRT 18/18 18 1/1 6XFFF     PAST MEDICAL HISTORY:  Past Medical History:  Diagnosis Date   Cancer ([facility]        PAST SURGICAL HISTORY: Past Surgical History:  Procedure Laterality Date   APPLICATION OF CRANIAL NAVIGATION N/A 10/20/2020   Procedure: APPLICATION OF CRANIAL NAVIGATION;  Surgeon: Kenneth Part MD;  [place]  Service: Neurosurgery;  Laterality: N/A;   CRANIOTOMY Right 10/20/2020   Procedure: Right sided craniotomy for tumor resection;  Surgeon: Kenneth Part MD;  [place] [facility]  Service: Neurosurgery;  Laterality: Right;     FAMILY HISTORY: No family history on file.   SOCIAL HISTORY:  reports that he has quit smoking. He uses smokeless tobacco. He reports current alcohol use. He reports current drug use. Drug: Marijuana.  The patient is divorced but has a good relationship with his ex-wife BLoris Winrowwho is his next of kin and surrogate medical decision maker as his healthcare power of attorney.  They have a adult daughter who is in college, and are raising their grandson.  The patient is currently out of custody however when we first met he was in [facility] on drug charges.  He is planning to move in to [name] very soon.   ALLERGIES: Penicillins   MEDICATIONS:  Current Outpatient Medications  Medication Sig Dispense Refill   dexamethasone (DECADRON) 4 MG tablet Take 1 tablet (4 mg total) by mouth 2 (two) times daily with a meal. Take 2 tablets x1 initially. Then take 1 tablet BID. 40 tablet 0   levETIRAcetam (KEPPRA) 500 MG tablet Take 1 tablet (500 mg total) by mouth 2 (two)  times daily. 60 tablet 5   prochlorperazine (COMPAZINE) 10 MG tablet Take 1 tablet (10 mg total) by mouth every 6 (six) hours as needed for nausea or vomiting. 30 tablet 0   No current facility-administered medications for this encounter.     REVIEW OF SYSTEMS: On review of systems, the patient reports that the patient states he is feeling much better since starting steroids.  He is not complaining of any headaches visual disturbances dizziness or nausea.  Kenneth Grant was able to confirm her impression as well.  He was very much on board with discussing steroids and the use of them.  When the conversation turned towards his MRI scan findings however he stated that his connection was breaking up and that we he preferred that [place] and I just talked.  No other complaints are verbalized.    PHYSICAL EXAM: Unable to assess due to encounter type.   ECOG = 1  0 - Asymptomatic (Fully active, able to carry on all predisease activities without restriction)  1 - Symptomatic but completely ambulatory (Restricted in physically strenuous activity but ambulatory and able to carry out work of a light or sedentary nature. For example, light housework, office work)  2 - Symptomatic, <50% in bed during the day (Ambulatory and capable of all self care but unable to carry out any work activities. Up and about more than 50% of waking hours)  3 - Symptomatic, >50% in bed, but not bedbound (Capable of only limited self-care, confined to bed or chair 50% or more of waking hours)  4 - Bedbound (Completely disabled. Cannot carry on any self-care. Totally confined to bed or chair)  5 - Death   Kenneth Grant MM, Kenneth Grant, Kenneth Grant, et al. [phone]). "Toxicity and response criteria of the [facility] Group". [place] Oncol. 5 (6): 649-55    LABORATORY DATA:  Lab Results  Component Value Date   WBC 10.4 01/19/2021   HGB 13.5 01/19/2021   HCT 39.4 01/19/2021   MCV 77.1 (L) 01/19/2021   PLT 220  01/19/2021   Lab Results  Component Value Date   NA 135 01/19/2021   K 4.4 01/19/2021   CL 104 01/19/2021   CO2 24 01/19/2021   Lab Results  Component Value Date   ALT 14 01/19/2021   AST 17 01/19/2021   ALKPHOS 102 01/19/2021   BILITOT 0.4 01/19/2021      RADIOGRAPHY: MR Brain W Wo Contrast  Result Date: 01/19/2021 CLINICAL DATA:  Follow-up metastatic renal cell carcinoma to the brain EXAM: MRI HEAD WITHOUT AND WITH CONTRAST TECHNIQUE: Multiplanar, multiecho pulse sequences of the brain and surrounding structures were obtained without and with intravenous contrast. CONTRAST:  77m GADAVIST GADOBUTROL 1 MMOL/ML IV SOLN COMPARISON:  10/21/2020 FINDINGS: BRAIN New Lesions: 1. 4 mm lesion in the left occipital pole on 1100:108 2. punctate metastasis in the inferior right temporal lobe on 1100:135 3. Ring-enhancing 7 mm metastasis in the anterior left temporal lobe on image 136 4. Punctate right superior cerebellar lesion on  image 135 5. Ring-enhancing left brainstem lesion measuring 16 mm on image 138. 6. Ring-enhancing 22 mm lesion in the inferior right temporal lobe on image 129 7. 2 cm left occipital lesion on image 143 with regional sulcal enhancement which may reflect local subarachnoid growth. 8. 15 mm in the right temporal lobe on image 151 9. Punctate in the right occipital parietal region on image 165 10. 14 mm in the inferior right frontal lobe on image 177 11. 3 mm lesion in the parasagittal left occipital parietal region on image 189 12. 3 mm lesion in the parasagittal left occipital parietal region on image 189 13. 5 mm lesion in the right parietal cortex on image 192 14. 14 mm in the parasagittal left parietal lobe on image 196 15. 12 mm in the parasagittal left parietal lobe on image 207 16. 4 mm in the lateral right frontal lobe on image 211 17. 6 mm in the right anterior and high parietal region on image 215 18. 7 mm in the anterior left frontal lobe on image 214 19. Punctate in the  left frontal white matter on image 219 20. Punctate in the parasagittal juxtacortical right frontal lobe on image 225 21. 23 mm in the high left frontal parietal junction on image 235 22. 11 mm in the high left frontal lobe on image 246 Some of these lesions show signs of mild hemorrhagic character, correlating with history of renal cell origin. There is exuberant brain edema similar to the treated right frontal lobe lesion. Left midbrain edema is particularly notable for its distortion on the brainstem. Stable or Smaller lesions: The treated lateral right frontal lobe lesion has regressed in size with chronic blood products Other Brain findings: No superimposed infarct, hydrocephalus, or collection. Vascular: Normal flow voids. Skull and upper cervical spine: Unremarkable right-sided craniotomy Sinuses/Orbits: No acute or worrisome finding IMPRESSION: 1. Marked progression with 22 new brain metastases and extensive associated vasogenic edema. 2. Regression of the treated right frontal lobe metastasis. Electronically Signed   By: Monte Fantasia M.D.   On: 01/19/2021 15:45       IMPRESSION/PLAN: 1. Stage IV Renal Cell Carcinoma with progressive brain metastases. Dr. Lisbeth Renshaw presented his case this morning in brain and spine oncology conference and he has significant disease burden, and has been quite symptomatic. Since starting steroids, he seems to be doing quite well clinically. While whole brain radiation is an option, and possible SRS salvage if his performance status is appropriate, it would not be unreasonable as well to consider hospice based care. We discussed the risks, benefits, short, and long term effects of radiotherapy.again the patient was not a Grant of the conversation during the conclusion of our discussion.  He will meet with Dr. Mickeal Skinner tomorrow I encouraged the Vaughan Basta to try and attend that appointment whether it was in person or by phone.  If he were to pursue whole brain radiation, Dr. Lisbeth Renshaw  would offer 2 week course of treatment.  And we will follow-up with his decision making once he is seen by Dr. Mickeal Skinner. He will continue steroids at his current dose of 4 mg twice daily.. 2. Social considerations.  I think it would be wise to have him meet with social work and palliative care as well given some of the concerns Kenneth Grant raised about stability of relationships with those who have previously attended his appointments.    Given current concerns for patient exposure during the COVID-19 pandemic, this encounter was conducted via telephone.  The  patient has provided two factor identification and has given verbal consent for this type of encounter and has been advised to only accept a meeting of this type in a secure network environment. The time spent during this encounter was 90 minutes including preparation, discussion, and coordination of the patient's care. The attendants for this meeting include  Hayden Pedro  and [name] and his ex wife Guthrie Lemme. During the encounter,    Hayden Pedro was located at [facility] Radiation Oncology Department.  Aadan Chenier was located at home. Moosa Bueche was at work.     Carola Rhine, [facility], [place]   **Disclaimer: This note was dictated with voice recognition software. Similar sounding words can inadvertently be transcribed and this note may contain transcription errors which may not have been corrected upon publication of note.**

## 2021-01-22 NOTE — Progress Notes (Signed)
Patient reports doing very well. Denies headaches, fatigue, skin irritation, vision/ hearing changes, nausea, motor skill/ cognitive issues, aphasia, or oral issues.  Meaningful use questions complete and patient notified of 3:00 telephone appointment today (01/22/21) and expressed understanding.

## 2021-01-22 NOTE — Telephone Encounter (Signed)
PC to patient, informed him he has CT appointment at [facility] on 02/02/21 at 1630, he needs to arrive at 4:00 p.m.  He is to have nothing to eat or drink after 12:30 p.m. except for his contrast.  He is to drink one bottle at 2:30 p.m. & another at 3:30 p.m.  Patient advised to pick up contrast at [facility] anytime before the day of his CT.  Patient verbalizes understanding.

## 2021-01-22 NOTE — Addendum Note (Signed)
Encounter addended by: Hayden Pedro, PA-C on: 01/22/2021 6:45 PM  Actions taken: Level of Service modified

## 2021-01-23 ENCOUNTER — Other Ambulatory Visit: Payer: Self-pay

## 2021-01-23 ENCOUNTER — Inpatient Hospital Stay (HOSPITAL_BASED_OUTPATIENT_CLINIC_OR_DEPARTMENT_OTHER): Payer: Self-pay | Admitting: Internal Medicine

## 2021-01-23 VITALS — BP 136/76 | HR 93 | Temp 97.6°F

## 2021-01-23 DIAGNOSIS — C7931 Secondary malignant neoplasm of brain: Secondary | ICD-10-CM

## 2021-01-23 DIAGNOSIS — Z7189 Other specified counseling: Secondary | ICD-10-CM

## 2021-01-23 NOTE — Progress Notes (Signed)
[facility] at [place], [place] [address] [phone]   New Patient Evaluation  Date of Service: 01/23/21 Patient Name: Miguelito Schnaible Patient MRN: [id number] Patient DOB: 1961/01/30 Provider: Ventura Sellers, MD  Identifying Statement:  Wacey Varnes is a 60 y.o. male with Brain metastases [facility])  Goals of care, counseling/discussion who presents for initial consultation and evaluation regarding cancer associated neurologic deficits.    Referring Provider: Wyatt Portela, MD [address] [place],   [address]  Primary Cancer:  Oncologic History: Oncology History  Renal cell carcinoma of left kidney ([place])  10/16/2020 Initial Diagnosis   Renal cell carcinoma of left kidney ([place])   11/07/2020 Cancer Staging   Staging form: Kidney, AJCC 8th Edition - Clinical: Stage IV (cTX, cNX, pM1) - Signed by Wyatt Portela, MD on 11/07/2020   11/17/2020 -  Chemotherapy      Patient is on Antibody Plan: RENAL CELL CARCINOMA NIVOLUMAB + IPILIMUMAB Q21D / NIVOLUMAB Q14D      CNS Oncologic History 10/19/20: Pre-op SRS Lisbeth Renshaw) 10/20/20: Craniotomy, resection by Dr. Zada Finders  History of Present Illness: The patient's records from the referring physician were obtained and reviewed and the patient interviewed to confirm this HPI.  Ramez Marberry presents to clinic today after recent MRI brain study.  He and his family member (ex-wife's sister) describe ongoing difficulty with balance, coordination.  His thinking has "slowed down" and memory seems to be very poor.  There has been some improvement since the steroids were started this weekend.  He is still living alone, cooking for himself.  Prior to the steroids "had to hold on to the walls" to get through the house.  Headaches are also improved since starting the decadron.  Continues on dual immunotherapy with Dr. Alen Blew.  Medications: Current Outpatient Medications on File Prior  to Visit  Medication Sig Dispense Refill   dexamethasone (DECADRON) 4 MG tablet Take 1 tablet (4 mg total) by mouth 2 (two) times daily with a meal. Take 2 tablets x1 initially. Then take 1 tablet BID. 40 tablet 0   levETIRAcetam (KEPPRA) 500 MG tablet Take 1 tablet (500 mg total) by mouth 2 (two) times daily. 60 tablet 5   prochlorperazine (COMPAZINE) 10 MG tablet Take 1 tablet (10 mg total) by mouth every 6 (six) hours as needed for nausea or vomiting. (Patient not taking: Reported on 01/23/2021) 30 tablet 0   No current facility-administered medications on file prior to visit.    Allergies:  Allergies  Allergen Reactions   Penicillins Hives   Past Medical History:  Past Medical History:  Diagnosis Date   Cancer [facility])    Past Surgical History:  Past Surgical History:  Procedure Laterality Date   APPLICATION OF CRANIAL NAVIGATION N/A 10/20/2020   Procedure: APPLICATION OF CRANIAL NAVIGATION;  Surgeon: Judith Part, MD;  Location: [place];  Service: Neurosurgery;  Laterality: N/A;   CRANIOTOMY Right 10/20/2020   Procedure: Right sided craniotomy for tumor resection;  Surgeon: Judith Part, MD;  Location: [facility];  Service: Neurosurgery;  Laterality: Right;   Social History:  Social History   Socioeconomic History   Marital status: Divorced    Spouse name: Not on file   Number of children: Not on file   Years of education: Not on file   Highest education level: Not on file  Occupational History   Not on file  Tobacco Use   Smoking status: Former  Smokeless tobacco: Current  Vaping Use   Vaping Use: Never used  Substance and Sexual Activity   Alcohol use: Yes    Comment: 12-20 mixed drinks and beer   Drug use: Yes    Types: Marijuana    Comment: weekly   Sexual activity: Not on file  Other Topics Concern   Not on file  Social History Narrative   Not on file   Social Determinants of Health   Financial Resource Strain: Not on file  Food Insecurity: Not on  file  Transportation Needs: Not on file  Physical Activity: Not on file  Stress: Not on file  Social Connections: Not on file  Intimate Partner Violence: Not on file   Family History: No family history on file.  Review of Systems: Constitutional: Doesn't report fevers, chills or abnormal weight loss Eyes: Doesn't report blurriness of vision Ears, nose, mouth, throat, and face: Doesn't report sore throat Respiratory: Doesn't report cough, dyspnea or wheezes Cardiovascular: Doesn't report palpitation, chest discomfort  Gastrointestinal:  Doesn't report nausea, constipation, diarrhea GU: Doesn't report incontinence Skin: Doesn't report skin rashes Neurological: Per HPI Musculoskeletal: Doesn't report joint pain Behavioral/Psych: Doesn't report anxiety  Physical Exam: Vitals:   01/23/21 1111  BP: 136/76  Pulse: 93  Temp: 97.6 F (36.4 C)  SpO2: 97%   KPS: 70. General: Alert, cooperative, pleasant, in no acute distress Head: Normal EENT: No conjunctival injection or scleral icterus.  Lungs: Resp effort normal Cardiac: Regular rate Abdomen: Non-distended abdomen Skin: No rashes cyanosis or petechiae. Extremities: No clubbing or edema  Neurologic Exam: Mental Status: Awake, alert, attentive to examiner. Oriented to self and environment. Language exhibits some disruption in fluency.  Impaired insight into disease process, age advanced psychomotor slowing and short term memory impairment.  Cranial Nerves: Visual acuity is grossly normal. Visual fields are full. Extra-ocular movements intact. No ptosis. Face is symmetric Motor: Tone and bulk are normal. Power is full in both arms and legs. Reflexes are symmetric, no pathologic reflexes present.  Sensory: Intact to light touch Gait: Normal.   Labs: I have reviewed the data as listed    Component Value Date/Time   NA 135 01/19/2021 1047   K 4.4 01/19/2021 1047   CL 104 01/19/2021 1047   CO2 24 01/19/2021 1047   GLUCOSE  107 (H) 01/19/2021 1047   BUN 14 01/19/2021 1047   CREATININE 0.84 01/19/2021 1047   CALCIUM 10.3 01/19/2021 1047   PROT 7.1 01/19/2021 1047   ALBUMIN 3.6 01/19/2021 1047   AST 17 01/19/2021 1047   ALT 14 01/19/2021 1047   ALKPHOS 102 01/19/2021 1047   BILITOT 0.4 01/19/2021 1047   GFRNONAA >60 01/19/2021 1047   Lab Results  Component Value Date   WBC 10.4 01/19/2021   NEUTROABS 7.7 01/19/2021   HGB 13.5 01/19/2021   HCT 39.4 01/19/2021   MCV 77.1 (L) 01/19/2021   PLT 220 01/19/2021    Imaging:  MR Brain W Wo Contrast  Result Date: 01/19/2021 CLINICAL DATA:  Follow-up metastatic renal cell carcinoma to the brain EXAM: MRI HEAD WITHOUT AND WITH CONTRAST TECHNIQUE: Multiplanar, multiecho pulse sequences of the brain and surrounding structures were obtained without and with intravenous contrast. CONTRAST:  42m GADAVIST GADOBUTROL 1 MMOL/ML IV SOLN COMPARISON:  10/21/2020 FINDINGS: BRAIN New Lesions: 1. 4 mm lesion in the left occipital pole on 1100:108 2. punctate metastasis in the inferior right temporal lobe on 1100:135 3. Ring-enhancing 7 mm metastasis in the anterior left temporal lobe on  image 136 4. Punctate right superior cerebellar lesion on image 135 5. Ring-enhancing left brainstem lesion measuring 16 mm on image 138. 6. Ring-enhancing 22 mm lesion in the inferior right temporal lobe on image 129 7. 2 cm left occipital lesion on image 143 with regional sulcal enhancement which may reflect local subarachnoid growth. 8. 15 mm in the right temporal lobe on image 151 9. Punctate in the right occipital parietal region on image 165 10. 14 mm in the inferior right frontal lobe on image 177 11. 3 mm lesion in the parasagittal left occipital parietal region on image 189 12. 3 mm lesion in the parasagittal left occipital parietal region on image 189 13. 5 mm lesion in the right parietal cortex on image 192 14. 14 mm in the parasagittal left parietal lobe on image 196 15. 12 mm in the  parasagittal left parietal lobe on image 207 16. 4 mm in the lateral right frontal lobe on image 211 17. 6 mm in the right anterior and high parietal region on image 215 18. 7 mm in the anterior left frontal lobe on image 214 19. Punctate in the left frontal white matter on image 219 20. Punctate in the parasagittal juxtacortical right frontal lobe on image 225 21. 23 mm in the high left frontal parietal junction on image 235 22. 11 mm in the high left frontal lobe on image 246 Some of these lesions show signs of mild hemorrhagic character, correlating with history of renal cell origin. There is exuberant brain edema similar to the treated right frontal lobe lesion. Left midbrain edema is particularly notable for its distortion on the brainstem. Stable or Smaller lesions: The treated lateral right frontal lobe lesion has regressed in size with chronic blood products Other Brain findings: No superimposed infarct, hydrocephalus, or collection. Vascular: Normal flow voids. Skull and upper cervical spine: Unremarkable right-sided craniotomy Sinuses/Orbits: No acute or worrisome finding IMPRESSION: 1. Marked progression with 22 new brain metastases and extensive associated vasogenic edema. 2. Regression of the treated right frontal lobe metastasis. Electronically Signed   By: Monte Fantasia M.D.   On: 01/19/2021 15:45    [place] Clinician Interpretation: I have personally reviewed the radiological images as listed.  My interpretation, in the context of the patient's clinical presentation, is progressive disease   Assessment/Plan Brain metastases [facility])  Goals of care, counseling/discussion  Antonius Strasburger presents today with rapid and substantial progression of disease burden within the CNS.  Greater than 20 new lesions are identified, including >1cm lesions in the pons and left pre-central gyrus.  His balance and coordination are improved after the decadron, but his cognitive deficits persists.  He is able to  demonstrated the minimum level of insight and decision making today, but he will need to rely on family help going forward.  We had an extensive goals of care discussion today; he and his sister in law understand the limited options we have for treatment and meaningful prolongation of life without treatment related complications.  We did offer whole brain radiation, but with the caveat that cognitive impairment and brainstem inflammation could lead to worsening quality of life.  He understands there is no cure available in this circumstance.  We discussed the possibility of hospice referral as well.  He will think it over with his family over the next several days and get back to [place].    In the meantime, ok to continue decadron '4mg'$  BID as this has helped considerably.  We spent twenty additional  minutes teaching regarding the natural history, biology, and historical experience in the treatment of neurologic complications of cancer.   We appreciate the opportunity to participate in the care of [facility].  He will reach out to [place] with decision regarding radiation or hospice.  All questions were answered. The patient knows to call the clinic with any problems, questions or concerns. No barriers to learning were detected.  The total time spent in the encounter was 40 minutes and more than 50% was on counseling and review of test results   Ventura Sellers, MD Medical Director of Neuro-Oncology [facility] at [place] 01/23/21 2:25 PM

## 2021-01-24 ENCOUNTER — Telehealth: Payer: Self-pay | Admitting: *Deleted

## 2021-01-24 ENCOUNTER — Encounter: Payer: Self-pay | Admitting: Oncology

## 2021-01-24 NOTE — Telephone Encounter (Signed)
Received call from pt's ex-spouse, Belinda. She is listed as his emergency contact.  She had several questions/requests.:  #1 She will need a letter that outlines his terminal illness as his daughter needs one for college  and his Film/video editor  needs one as well.  #2  They would like a DNR form  #3 They are asking for a Hospice referral. [name] is trying to convince pt to live with her as she is concerned with his safety and would like to use Hospice in [facility]. Advised that I would call the referral in to [place]. She will be the contact person.  He still has infusion appts-is he stopping chemo?  #4 Hassan Rowan also asked about transferring to an ocologist in [place] as it is closer...please advise

## 2021-01-24 NOTE — Telephone Encounter (Signed)
N/A deletion error

## 2021-01-26 ENCOUNTER — Other Ambulatory Visit: Payer: Self-pay

## 2021-01-26 DIAGNOSIS — C7931 Secondary malignant neoplasm of brain: Secondary | ICD-10-CM

## 2021-01-26 DIAGNOSIS — C642 Malignant neoplasm of left kidney, except renal pelvis: Secondary | ICD-10-CM

## 2021-01-26 NOTE — Progress Notes (Signed)
Referral to hospice 

## 2021-01-31 ENCOUNTER — Telehealth: Payer: Self-pay | Admitting: *Deleted

## 2021-01-31 NOTE — Telephone Encounter (Signed)
Referral called to [facility] 

## 2021-02-02 ENCOUNTER — Ambulatory Visit (HOSPITAL_COMMUNITY): Payer: Self-pay

## 2021-02-05 ENCOUNTER — Telehealth: Payer: Self-pay | Admitting: *Deleted

## 2021-02-05 NOTE — Telephone Encounter (Signed)
Opened in error

## 2021-02-05 NOTE — Telephone Encounter (Signed)
Per Margie with [facility], patient entered hospice care 02/01/21. Dr. Alen Blew informed. He requested patient appts at [facility] for labs/MD/Infusions be cancelled as Hospice will be providing his care at this time.  Contacted patient to provide this information. LVM for patient with this information. Contacted Ms. Diener (patient's emergency contact) to advise her of this as well. She verbalized understanding.  Appts at [place] cancelled

## 2021-02-08 ENCOUNTER — Other Ambulatory Visit: Payer: Self-pay

## 2021-02-08 ENCOUNTER — Ambulatory Visit: Payer: Self-pay | Admitting: Oncology

## 2021-02-08 ENCOUNTER — Ambulatory Visit: Payer: Self-pay

## 2021-02-08 ENCOUNTER — Encounter: Payer: Self-pay | Admitting: Nutrition

## 2021-02-12 ENCOUNTER — Telehealth: Payer: Self-pay | Admitting: *Deleted

## 2021-02-24 NOTE — Telephone Encounter (Signed)
[facility] called to report patient passed away today 12-Mar-2021 @ 2:25 am.

## 2021-02-24 DEATH — deceased

## 2021-03-09 ENCOUNTER — Ambulatory Visit: Payer: Self-pay

## 2021-03-09 ENCOUNTER — Ambulatory Visit: Payer: Self-pay | Admitting: Oncology

## 2021-03-09 ENCOUNTER — Other Ambulatory Visit: Payer: Self-pay
